# Patient Record
Sex: Female | Born: 1940
Health system: Southern US, Community
[De-identification: ages and names within clinical notes are randomized; demographics above are authoritative.]

## PROBLEM LIST (undated history)

## (undated) DIAGNOSIS — I10 Essential (primary) hypertension: Secondary | ICD-10-CM

## (undated) DIAGNOSIS — I509 Heart failure, unspecified: Secondary | ICD-10-CM

## (undated) DIAGNOSIS — E119 Type 2 diabetes mellitus without complications: Secondary | ICD-10-CM

## (undated) DIAGNOSIS — M1990 Unspecified osteoarthritis, unspecified site: Secondary | ICD-10-CM

## (undated) DIAGNOSIS — K8020 Calculus of gallbladder without cholecystitis without obstruction: Secondary | ICD-10-CM

## (undated) DIAGNOSIS — I2510 Atherosclerotic heart disease of native coronary artery without angina pectoris: Secondary | ICD-10-CM

## (undated) DIAGNOSIS — I5043 Acute on chronic combined systolic (congestive) and diastolic (congestive) heart failure: Secondary | ICD-10-CM

## (undated) HISTORY — PX: CHOLECYSTECTOMY: SHX55

## (undated) HISTORY — DX: Acute on chronic combined systolic (congestive) and diastolic (congestive) heart failure: I50.43

## (undated) HISTORY — DX: Calculus of gallbladder without cholecystitis without obstruction: K80.20

## (undated) HISTORY — PX: CATARACT EXTRACTION: SUR2

## (undated) HISTORY — DX: Atherosclerotic heart disease of native coronary artery without angina pectoris: I25.10

## (undated) HISTORY — PX: ABDOMINAL HYSTERECTOMY: SHX81

## (undated) HISTORY — PX: TOTAL KNEE ARTHROPLASTY: SHX125

---

## 1999-03-26 ENCOUNTER — Encounter: Admission: RE | Admit: 1999-03-26 | Discharge: 1999-03-26 | Payer: Self-pay | Admitting: Family Medicine

## 1999-03-26 ENCOUNTER — Encounter: Payer: Self-pay | Admitting: Family Medicine

## 2000-04-01 ENCOUNTER — Encounter: Payer: Self-pay | Admitting: Family Medicine

## 2000-04-01 ENCOUNTER — Encounter: Admission: RE | Admit: 2000-04-01 | Discharge: 2000-04-01 | Payer: Self-pay | Admitting: Family Medicine

## 2000-04-20 ENCOUNTER — Other Ambulatory Visit: Admission: RE | Admit: 2000-04-20 | Discharge: 2000-04-20 | Payer: Self-pay | Admitting: Family Medicine

## 2001-04-25 ENCOUNTER — Encounter: Admission: RE | Admit: 2001-04-25 | Discharge: 2001-04-25 | Payer: Self-pay | Admitting: Family Medicine

## 2001-04-25 ENCOUNTER — Encounter: Payer: Self-pay | Admitting: Family Medicine

## 2001-10-10 ENCOUNTER — Other Ambulatory Visit: Admission: RE | Admit: 2001-10-10 | Discharge: 2001-10-10 | Payer: Self-pay | Admitting: Family Medicine

## 2002-01-18 ENCOUNTER — Ambulatory Visit (HOSPITAL_COMMUNITY): Admission: RE | Admit: 2002-01-18 | Discharge: 2002-01-18 | Payer: Self-pay | Admitting: Gastroenterology

## 2002-01-18 ENCOUNTER — Encounter (INDEPENDENT_AMBULATORY_CARE_PROVIDER_SITE_OTHER): Payer: Self-pay | Admitting: Specialist

## 2002-04-27 ENCOUNTER — Emergency Department (HOSPITAL_COMMUNITY): Admission: EM | Admit: 2002-04-27 | Discharge: 2002-04-27 | Payer: Self-pay | Admitting: Emergency Medicine

## 2002-04-27 ENCOUNTER — Encounter: Payer: Self-pay | Admitting: Emergency Medicine

## 2002-06-18 ENCOUNTER — Encounter: Admission: RE | Admit: 2002-06-18 | Discharge: 2002-06-18 | Payer: Self-pay | Admitting: Family Medicine

## 2002-06-18 ENCOUNTER — Encounter: Payer: Self-pay | Admitting: Family Medicine

## 2002-10-16 ENCOUNTER — Other Ambulatory Visit: Admission: RE | Admit: 2002-10-16 | Discharge: 2002-10-16 | Payer: Self-pay | Admitting: Family Medicine

## 2002-10-31 ENCOUNTER — Encounter: Payer: Self-pay | Admitting: Family Medicine

## 2002-10-31 ENCOUNTER — Ambulatory Visit (HOSPITAL_COMMUNITY): Admission: RE | Admit: 2002-10-31 | Discharge: 2002-10-31 | Payer: Self-pay | Admitting: Family Medicine

## 2003-07-01 ENCOUNTER — Encounter: Admission: RE | Admit: 2003-07-01 | Discharge: 2003-07-01 | Payer: Self-pay | Admitting: Family Medicine

## 2003-09-04 ENCOUNTER — Ambulatory Visit (HOSPITAL_COMMUNITY): Admission: RE | Admit: 2003-09-04 | Discharge: 2003-09-04 | Payer: Self-pay | Admitting: Orthopedic Surgery

## 2003-09-10 ENCOUNTER — Ambulatory Visit (HOSPITAL_COMMUNITY): Admission: RE | Admit: 2003-09-10 | Discharge: 2003-09-10 | Payer: Self-pay | Admitting: Orthopedic Surgery

## 2003-09-12 ENCOUNTER — Encounter (HOSPITAL_COMMUNITY): Admission: RE | Admit: 2003-09-12 | Discharge: 2003-12-11 | Payer: Self-pay | Admitting: Orthopedic Surgery

## 2003-09-16 ENCOUNTER — Inpatient Hospital Stay (HOSPITAL_COMMUNITY): Admission: AD | Admit: 2003-09-16 | Discharge: 2003-09-19 | Payer: Self-pay | Admitting: Orthopedic Surgery

## 2003-09-17 ENCOUNTER — Encounter (INDEPENDENT_AMBULATORY_CARE_PROVIDER_SITE_OTHER): Payer: Self-pay | Admitting: Specialist

## 2004-07-22 ENCOUNTER — Encounter: Admission: RE | Admit: 2004-07-22 | Discharge: 2004-07-22 | Payer: Self-pay | Admitting: Family Medicine

## 2005-02-24 ENCOUNTER — Ambulatory Visit (HOSPITAL_COMMUNITY): Admission: RE | Admit: 2005-02-24 | Discharge: 2005-02-24 | Payer: Self-pay | Admitting: Gastroenterology

## 2005-02-24 ENCOUNTER — Encounter (INDEPENDENT_AMBULATORY_CARE_PROVIDER_SITE_OTHER): Payer: Self-pay | Admitting: *Deleted

## 2005-05-06 ENCOUNTER — Inpatient Hospital Stay (HOSPITAL_COMMUNITY): Admission: RE | Admit: 2005-05-06 | Discharge: 2005-05-11 | Payer: Self-pay | Admitting: Orthopedic Surgery

## 2005-07-26 ENCOUNTER — Encounter: Admission: RE | Admit: 2005-07-26 | Discharge: 2005-07-26 | Payer: Self-pay | Admitting: Family Medicine

## 2006-09-02 ENCOUNTER — Encounter: Admission: RE | Admit: 2006-09-02 | Discharge: 2006-09-02 | Payer: Self-pay | Admitting: Family Medicine

## 2008-01-04 ENCOUNTER — Encounter: Admission: RE | Admit: 2008-01-04 | Discharge: 2008-01-04 | Payer: Self-pay | Admitting: Family Medicine

## 2009-01-09 ENCOUNTER — Encounter: Admission: RE | Admit: 2009-01-09 | Discharge: 2009-01-09 | Payer: Self-pay | Admitting: Family Medicine

## 2010-01-12 ENCOUNTER — Encounter: Admission: RE | Admit: 2010-01-12 | Discharge: 2010-01-12 | Payer: Self-pay | Admitting: Family Medicine

## 2010-05-04 ENCOUNTER — Encounter: Payer: Self-pay | Admitting: Family Medicine

## 2010-08-28 NOTE — Op Note (Signed)
NAME:  Alexandra Green, KOVICH NO.:  [id number]   MEDICAL RECORD NO.:  [id number]          PATIENT TYPE:  AMB   LOCATION:  ENDO                         FACILITY:  [facility]   PHYSICIAN:  Anselmo Rod, M.D.  DATE OF BIRTH:  1941-02-14   DATE OF PROCEDURE:  02/24/2005  DATE OF DISCHARGE:                                 OPERATIVE REPORT   PROCEDURE:  Colonoscopy with core biopsies x 1.   ENDOSCOPIST:  Charna Elizabeth, M.D.   INSTRUMENT USED:  Olympus video colonoscope.   INDICATIONS FOR PROCEDURE:  70 year old African American female with a  history of blood in stool on Indocin, rule out colonic polyps, masses, etc.   PREPROCEDURE PREPARATION:  Informed consent was obtained from the patient.  The patient was fasted for eight hours prior to the procedure and prepped  with a bottle of magnesium citrate and a gallon of GoLYTELY the night prior  to the procedure.  The risks and benefits of the procedure including a 10%  miss rate of cancer and polyps were discussed with the patient, as well.   PREPROCEDURE PHYSICAL:  Patient with stable vital signs.  Neck supple.  Chest clear to auscultation.  Abdomen obese with no epigastric tenderness to  palpation, no guarding, no rebound, no rigidity, no hepatosplenomegaly.   DESCRIPTION OF PROCEDURE:  The patient was placed in the left lateral  decubitus position, sedated with an additional 20 mg of Demerol, the patient  had received Demerol and Versed for the EGD.  Once the patient was  adequately sedated, maintained on low flow oxygen and continuous cardiac  monitoring, the Olympus video colonoscope was advanced from the rectum to  the cecum.  The patient's position was changed from the left lateral to the  supine position with gentle application of abdominal pressure to reach the  cecal base.  A small sessile polyp was biopsied from 70 cm (cold biopsies x  1), a large lipoma was seen in the proximal right colon distal to the IC  valve.   The patient's position was changed from the left lateral to the  supine position with gentle application of abdominal pressure to reach the  cecal base.  The appendicular orifice and ileocecal valve were visualized  and photographed.  No other masses or polyps were identified.  A few early  sigmoid diverticula were present.  The patient tolerated the procedure well  without any complications.   IMPRESSION:  1.  A few early sigmoid diverticula.  2.  Large lipoma in the proximal right colon.  3.  A small sessile polyp removed by cold biopsy from 70 cm.  4.  Small internal hemorrhoids seen on retroflexion.   RECOMMENDATIONS:  1.  Await pathology results.  2.  Avoid all nonsteroidals for now.  3.  Outpatient follow up in the next two weeks for further recommendations.      Anselmo Rod, M.D.  Electronically Signed     JNM/MEDQ  D:  02/24/2005  T:  02/24/2005  Job:  [id number]   cc:   [name], M.D.  Fax: [phone]   Myrtie Neither,  MD  Fax: [phone]

## 2010-08-28 NOTE — Discharge Summary (Signed)
NAME:  Alexandra Green, Alexandra Green                       ACCOUNT NO.:  [id number]   MEDICAL RECORD NO.:  [id number]                   PATIENT TYPE:  INP   LOCATION:  5014                                 FACILITY:  [facility]   PHYSICIAN:  Myrtie Neither, M.D.                 DATE OF BIRTH:  Nov 18, 1940   DATE OF ADMISSION:  09/16/2003  DATE OF DISCHARGE:  09/19/2003                                 DISCHARGE SUMMARY   ADMISSION DIAGNOSES:  Osteomyelitis, diskitis of lumbar spine, rule out  mycotic disease.   DISCHARGE DIAGNOSES:  Osteomyelitis, diskitis of lumbar spine.   OPERATION:  None.   PROCEDURE:  Biopsy disk space by radiology.   CONSULTATIONS:  With infectious disease.   COMPLICATIONS:  None.   INFECTIONS:  Osteomyelitis, diskitis, lumbar vertebrae.   PERTINENT HISTORY:  This is a 70 year old female who I saw in the office for  lower back and right hip pain. The patient had been treated with anti-  inflammatories and muscle relaxants. The patient had recent MRI which  demonstrated diskitis and a disk space infection, osteomyelitis involving L4-  L5 disk space with involvement of only anterior longitudinal ligament. The  patient had C. reactive protein done as outpatient which was 0.6 which was  normal. Sed rate was 31. White cell count was normal, afebrile. Blood  cultures x2 which were negative. The patient had occupational PPD done which  was also negative. The patient was experiencing progressively worsening with  pain, weakness in the legs, and difficulty getting about.   PERTINENT PHYSICAL:  Lumbar minimal tender in mid lumbar area with only  trace straight leg raise bilaterally. Extensor hallucis is intact, no  clonus. Muscle tone and muscle strength is much improved from her previous  examination prior to the MRI. Deep tendon reflexes were +1 bilaterally  patella and Achilles. Pulses were intact. MRI demonstrated osteomyelitis and  diskitis at L4-L5 level with involvement along  the anterior longitudinal  ligament.   HOSPITAL COURSE:  The patient did have a short course of Keflex on  outpatient basis. The patient was admitted, had infectious disease  evaluation and scheduled for disk space biopsy. The patient underwent  procedure and tolerated procedure quite well. The patient had minimal lower  back and leg pain along the sciatic nerve distribution. IV antibiotics were  held off per recommendation of infectious disease. The patient's tissue  underwent cultures and Gram stain. Blood cultures remained negative. No acid  fast bacilli were isolated in six weeks. Renal cultures did not show any  growth in three days. Due to patient's reduced symptoms and present negative  cultures, recommendation was to observe the patient without antibiotics.  Plan on repeating the biopsy in four weeks as well as repeat of the  laboratory. The patient was discharged, continued on pain medication and  muscle relaxants p.r.n. The patient will followed up also by infectious  disease. Return to the office  in a two-week period. The patient was  discharged in stable and satisfactory condition.                                                Myrtie Neither, M.D.    AC/MEDQ  D:  11/13/2003  T:  11/14/2003  Job:  [id number]

## 2010-08-28 NOTE — Discharge Summary (Signed)
NAME:  Alexandra Green, Alexandra Green NO.:  [id number]   MEDICAL RECORD NO.:  [id number]          PATIENT TYPE:  INP   LOCATION:  5025                         FACILITY:  [facility]   PHYSICIAN:  Myrtie Neither, MD      DATE OF BIRTH:  07-May-1940   DATE OF ADMISSION:  05/06/2005  DATE OF DISCHARGE:  05/11/2005                                 DISCHARGE SUMMARY   ADMISSION DIAGNOSIS:  Degenerative arthropathy, left knee.   DISCHARGE DIAGNOSIS:  Degenerative arthropathy, left knee.   COMPLICATIONS:  Patient did have febrile episode coughing up yellow mucus.  Patient was placed on antibiotics.  Keflex 600 mg q.6h.  Encouraged to use  incentive spirometry.   PERTINENT HISTORY:  This is a 70 year old female followed in the office for  degenerative arthropathy, treated with anti-inflammatories and pain  medication, use of cane and walker with locking and giving way of the left  knee.  Patient pertinent physical was that of the left knee.  Crepitus,  effusion, limited range of motion.  Crepitus medial and lateral compartment  in patellofemoral joint with slight subluxation of the knee.   HOSPITAL COURSE:  Patient had preop medical evaluation by primary care  physician and found to be stable to undergo surgery.  Preop laboratories,  CBC, EKG, chest x-ray, urinalysis, PT, PTT, platelet count, CMET which were  all stable enough for patient to undergo surgery.  Patient underwent left  total knee arthroplasty.  Tolerated this procedure quite well.   POSTOP COURSE:  Patient did run febrile course day before discharge and was  placed on oral antibiotics with productive sputum.  Patient able to be  discharged on Keflex, Percocet every four hours as needed for pain, home  CPM, home health nurse and physical therapy.  Patient discharged in stable  condition.  Return to office in one week.  Partial weightbearing on left  side.      Myrtie Neither, MD  Electronically Signed     AC/MEDQ  D:   07/06/2005  T:  07/07/2005  Job:  [id number]

## 2010-08-28 NOTE — Op Note (Signed)
NAME:  Alexandra Green, Alexandra Green NO.:  [id number]   MEDICAL RECORD NO.:  [id number]          PATIENT TYPE:  AMB   LOCATION:  ENDO                         FACILITY:  [facility]   PHYSICIAN:  Anselmo Rod, M.D.  DATE OF BIRTH:  October 28, 1940   DATE OF PROCEDURE:  02/24/2005  DATE OF DISCHARGE:                                 OPERATIVE REPORT   PROCEDURE:  Esophagogastroduodenoscopy with antral biopsies.   ENDOSCOPIST:  Anselmo Rod, M.D.   INSTRUMENT USED:  Olympus video panendoscope.   INDICATIONS FOR PROCEDURE:  This 70 year old African/American female with a  history of melena for the last four days.  The patient has been on Indocin  since the summer of this year.  Rule out peptic ulcer disease, esophagitis,  gastritis, etc.   PRE-PROCEDURE PREPARATION:  An informed consent was procured from the  patient.  The patient was fasted for four hours prior to the procedure.   PRE-PROCEDURE PHYSICAL EXAMINATION:  VITAL SIGNS:  Stable.  NECK:  Supple.  CHEST:  Clear to auscultation.  HEART:  S1, S2 regular.  ABDOMEN:  Morbidly obese with epigastric tenderness on palpation with  guarding.  No rebound or rigidity.  No hepatosplenomegaly.   DESCRIPTION OF PROCEDURE:  The patient was placed in the left lateral  decubitus position and sedated with 40 mg of Demerol and 4 mg of Versed in  slow incremental doses.  Once the patient was adequately sedated, and  maintained on low-flow oxygen and continuous cardiac monitoring, the Olympus  video panendoscope was advanced through the mouth piece, over the tongue and  into the esophagus under direct vision.  The entire esophagus appeared  normal and widely patent, with no evidence of rings, strictures, masses,  esophagitis, or varus mucosa.  The scope was then advanced to the stomach.  Moderate diffuse gastritis was noted throughout the gastric mucosa with more  prominent changes in the antrum.  Biopsies were done for the presence of H.  pylori by pathology.  A large ulcer was seen in the duodenal bulb without  any evidence of a visible vessel.  This is the patient's probable site of GI  bleeding in the recent week.  The small bowel distal to the bulb, up to 60  cm appeared normal.  There was no outlet obstruction.  Retroflexion in the  high cardia revealed no evidence of a hiatal hernia.   IMPRESSION:  1.  Normal-appearing esophagus.  2.  Diffuse gastritis with more prominent changes in the antrum.  Biopsies      done for Helicobacter pylori.  3.  Large ulcer in the duodenal bulb without evidence of a visible vessel.  4.  Normal small bowel distal to the bulb, up to 60 cm.   RECOMMENDATIONS:  1.  Discontinue all nonsteroidals for now.  2.  Increase PPI's to b.i.d.  3.  Treat with antibiotics if H. pylori is present on biopsy.  4.  Proceed with a colonoscopy at this time. Further recommendations will be      made thereafter.      Anselmo Rod, M.D.  Electronically Signed  JNM/MEDQ  D:  02/24/2005  T:  02/24/2005  Job:  [id number]   cc:   [name], M.D.  Fax: [phone]   Myrtie Neither, MD  Fax: [phone]

## 2010-08-28 NOTE — Op Note (Signed)
NAME:  Alexandra Green, Alexandra Green NO.:  [id number]   MEDICAL RECORD NO.:  [id number]          PATIENT TYPE:  INP   LOCATION:  5025                         FACILITY:  [facility]   PHYSICIAN:  Myrtie Neither, MD      DATE OF BIRTH:  [birth year]   DATE OF PROCEDURE:  05/06/2005  DATE OF DISCHARGE:  05/11/2005                                 OPERATIVE REPORT   PREOPERATIVE DIAGNOSIS:  Degenerative arthritis, left knee.   POSTOPERATIVE DIAGNOSIS:  Degenerative arthritis, left knee.   COMPLICATIONS:  None.   INFECTIONS:  None.   OPERATION PERFORMED:  Left total knee arthroplasty.   SURGEON:  Myrtie Neither, MD   ANESTHESIA:  General.   ESTIMATED BLOOD LOSS:  100 mL.   DESCRIPTION OF PROCEDURE:  The patient was taken to the operating room after  given adequate preop medication and given general anesthesia and intubated.  Left lower extremity was prepped with DuraPrep and draped in sterile manner.  Tourniquet and Bovie used for hemostasis.  Anterior midline incision made at  the left knee going through skin and subcutaneous tissue extending from the  quadricep down to tibial tuberosity.  Sharp and blunt dissection was done  medially and medial paramedian incision made into the capsule extending from  the quadriceps down to the tibial tuberosity.  Patella was reflected  laterally.  Osteophytes about the patella, femur and tibia resected.  Soft  tissue resection was then done with the knee in the flexed position.  The  tibial cutting jig was put in place for removal of 10 mm of tibial surface.  IM rod reaming was then done, put down the femoral canal and distal femoral  cutting jig was put in place followed by sizing of the femur which was 65  mm.  Anterior, posterior and chamfering cut jig was put in place and  appropriate cuts were made.  Trial component of the femur was done which was  found to fit very snug.  Attention was then turned back to the tibia.  Tibial sizing was 71 mm.   Appropriate cutting jigs were cut in place and the  tibial surface was prepared.  Trial component was put in place for the femur  and the tibial component.  Range of motion full extension, full flexion,  good medial and lateral stability.  This was most stable with 16 mm poly.  Next attention was then turned to patella which was sized at a large sized  patella, 37 mm.  Appropriate sizing was done followed by placement of  cutting jig for resurfacing of patella.  All three components were put in  place. Range of motion full flexion, full extension, no subluxation of  patella and this is best felt with the 16 mm poly.  Next, bone cement was  mixed and the tibia and patella were cemented and the femoral component was  press-fit.  Again range of motion found to be good full extension, full  flexion, good medial and lateral stability, no subluxation, patella with  good tracking.  Tourniquet was then let down.  Hemostasis obtained.  Copious  irrigation  was then done.  Final implants were put in place were 65 mm  femoral component, press-fit, porous coated. 16 x 71 mm poly, 71 mm fixed  high beam tibial plate and 37 mm large size patella.  These were [employer]  components.  The  poly was then locked in place with the key.  After hemostasis obtained,  wound closing was done with 0 Vicryl to the fascia, 2-0 for the subcutaneous  and skin staples for the skin.  Compressive dressing was applied.  The  patient tolerated the procedure well, went to recovery room in stable  satisfactory condition.      Myrtie Neither, MD  Electronically Signed     AC/MEDQ  D:  07/06/2005  T:  07/07/2005  Job:  [id number]

## 2010-08-28 NOTE — Op Note (Signed)
NAME:  Alexandra Green, Alexandra Green                       ACCOUNT NO.:  [id number]   MEDICAL RECORD NO.:  [id number]                   PATIENT TYPE:  AMB   LOCATION:  ENDO                                 FACILITY:  [facility]   PHYSICIAN:  Anselmo Rod, M.D.               DATE OF BIRTH:  [birth year]   DATE OF PROCEDURE:  01/18/2002  DATE OF DISCHARGE:                                 OPERATIVE REPORT   PROCEDURE PERFORMED:  Colonoscopy with biopsies time four.   ENDOSCOPIST:  Charna Elizabeth, M.D.   INSTRUMENT USED:  Olympus video colonoscope.   INDICATIONS FOR PROCEDURE:  The patient is a 70 year old African-American  female with a history of change in bowel habits and constipation.  The  patient has a family history of breast cancer but denies a family history of  colon cancer.   PREPROCEDURE PREPARATION:  Informed consent was procured from the patient.  The patient was fasted for eight hours prior to the procedure and prepped  with a bottle of magnesium citrate and a gallon of NuLytely the night prior  to the procedure.   PREPROCEDURE PHYSICAL:  The patient had stable vital signs. Neck supple.  Chest clear to auscultation.  S1 and S2 regular.  Abdomen soft with normal  bowel sounds.   DESCRIPTION OF PROCEDURE:  The patient was placed in left lateral decubitus  position and sedated with 50 mg of Demerol and 5 mg of Versed intravenously.  Once the patient was adequately sedated and maintained on low flow oxygen  and continuous cardiac monitoring, the Olympus video colonoscope was  advanced from the rectum to the cecum with difficulty.  The patient was  moderately obese.  Multiple positions were changed from the left lateral to  supine and the right lateral position to reach the cecal base.  There was  some residual stool in the right colon and multiple washes were done.  The  cecal base was clearly visualized.  A lipomatous lesion was seen in the mid  right colon.  It was biopsied for  pathology.  No other abnormalities were  noted.  There was evidence of early diverticular disease in the left colon.  The patient tolerated the procedure well without complications.   IMPRESSION:  1. Early left-sided diverticulosis.  2. Question lipoma in mid right colon.  3. No other masses or polyps seen.   RECOMMENDATIONS:  1. A high fiber diet has been discussed with the patient and __________     brochures have been given to her for her education.  Liberal fluid intake     has been advocated.  2. Repeat colorectal cancer screening has been recommended in the next 10     years unless the patient develops any abnormal symptoms in the interim.  3. Outpatient follow-up in the next two weeks or earlier if need be.  4.     Patient wanted to try Zelnorm, actually has  been advised to contact the     office with regard to results of this medication and request a refill if     this seems to help her symptoms; however, the importance of 15 to 20 gm     of fiber in the diet have been re-emphasized and should not be replaced     by the use of Zelnorm.                                                   Anselmo Rod, M.D.    JNM/MEDQ  D:  01/18/2002  T:  01/18/2002  Job:  [id number]   cc:   [name], MD

## 2010-08-28 NOTE — H&P (Signed)
NAME:  Alexandra Green, Alexandra Green                       ACCOUNT NO.:  [id number]   MEDICAL RECORD NO.:  [id number]                   PATIENT TYPE:  INP   LOCATION:  5014                                 FACILITY:  [facility]   PHYSICIAN:  Myrtie Neither, M.D.                 DATE OF BIRTH:  10-11-1940   DATE OF ADMISSION:  09/16/2003  DATE OF DISCHARGE:                                HISTORY & PHYSICAL   CHIEF COMPLAINT:  Lower back and leg pain.   HISTORY OF PRESENT ILLNESS:  This is a 70 year old female previously treated  for lower back sciatic type pain.  Over the past several weeks, the patient  had MRI done approximately a week and a half ago because of progressive  worsening of symptoms which was that of radiation of pain into the legs and  feeling of weakness in both legs.  MRI demonstrated diskitis and disk  infection, osteomyelitis involving L4-L5 disk space and with __________ of  the anterior longitudinal ligament.  The patient had C-reactive protein done  which was 0.6 which is normal.  Sed rate of only 31.  White cell count was  normal.  Afebrile and had blood cultures x 2 done which so far have been  negative.  The patient's urine test also was negative.  The patient has a  history of having PPD done occupationally at [facility] which was  also negative.  The patient is being admitted for a biopsy to the area and  infectious disease evaluation.  The patient was placed on Keflex 500 mg  q.6h.   PAST MEDICAL HISTORY:  1. Diabetes mellitus.  2. High blood pressure.   ALLERGIES:  SPORANOX which causes a rash.   FAMILY HISTORY:  Diabetes mellitus, prostatic cancer, coronary artery  disease.   MEDICATIONS:  Amaryl, Metformin, Actos, Detrol, Norvasc, Lasix, Skelaxin,  HCTZ, Lisinopril, and Keflex.   REVIEW OF SYSTEMS:  No cardiac arrest.  Some hesitancy on urination.  Numbness in both lower extremities off and on.   SOCIAL HISTORY:  The patient has no history of use of  alcohol or tobacco and  lives with her son.   PHYSICAL EXAMINATION:  GENERAL:  Alert and oriented, no acute distress.  VITAL SIGNS:  Temperature 98.6, pulse 80, respirations 20, blood pressure  124/58.  CBG of 114.  Height 5' 7, weight 318.  HEAD:  Normocephalic.  EYES:  Conjunctivae sclerae clear.  NECK:  Supple.  CHEST:  Clear.  CARDIAC:  S1 S2 regular.  BACK:  Lumbar minimally tender in the mid lumbar area with only a trace  straight leg raise bilaterally.  Extensor hallus intact.  No clonus.  Muscle  tone and muscle strength are much improved from previous examination.  Deep  tendon reflexes +1 bilateral patella and Achilles.  Pulses intact.   LABORATORY:  The patient had MRI done demonstrating osteomyelitis, diskitis  at L5-L5 with __________ at  the anterior longitudinal ligament.   IMPRESSION:  Osteomyelitis, diskitis lumbar spine rule out mytotic disease.   PLAN:  Admission for a biopsy of the area as well as infectious disease  consultation.                                                Myrtie Neither, M.D.    AC/MEDQ  D:  09/17/2003  T:  09/18/2003  Job:  [id number]

## 2010-08-28 NOTE — Op Note (Signed)
NAME:  Alexandra Green, Alexandra Green NO.:  [id number]   MEDICAL RECORD NO.:  [id number]          PATIENT TYPE:  AMB   LOCATION:  ENDO                         FACILITY:  [facility]   PHYSICIAN:  Anselmo Rod, M.D.  DATE OF BIRTH:  16-Feb-1941   DATE OF PROCEDURE:  02/24/2005  DATE OF DISCHARGE:                                 OPERATIVE REPORT   PROCEDURE PERFORMED:  Colonoscopy with cold biopsies times one.   ENDOSCOPIST:  Charna Elizabeth, M.D.   INSTRUMENT USED:  Olympus video colonoscope.   INDICATIONS FOR PROCEDURE:  The patient is a 70 year old African-American  female with history of blood in stool.  Rule out colonic polyps, masses,  etc.   PREPROCEDURE PREPARATION:  Informed consent was procured from the patient.  The patient was fasted for eight hours prior to the procedure and prepped  with a bottle of magnesium citrate and a gallon of GoLytely the night prior  to the procedure.  The risks and benefits of the procedure including a 10%  miss rate for polyps or cancers was discussed with the patient as well.  The  patient was advised to discontinue Indocin and all nonsteroidals prior to  the procedure.   PREPROCEDURE PHYSICAL:  The patient had stable vital signs.  Neck supple.  Chest clear to auscultation.  S1 and S2 regular.  Abdomen obese with  epigastric tenderness on palpation with guarding, no rebound, no rigidity,  no hepatosplenomegaly.  Surgical scar is present from an open  cholecystectomy and hysterectomy done in remote past.   DESCRIPTION OF PROCEDURE:  The patient was placed in left lateral decubitus  position and sedated with an additional 20 mg of Demerol.  The patient  received Demerol and Versed prior to her colonoscopy.  Once the patient was  adequately sedated and maintained on low flow oxygen and continuous cardiac  monitoring, the Olympus video colonoscope was advanced from the rectum to  the cecum.  The appendicular orifice and ileocecal valve were  clearly  visualized and photographed.  A large lipoma was noted in the proximal right  colon. A few sigmoid diverticula were present.  A small sessile polyp was  biopsied from 70 cm.  Small internal hemorrhoids were seen on retroflexion.  The rest of the exam was unremarkable.  There was some residual stool in the  colon.  Multiple washes were done.  The patient tolerated the procedure well  without immediate complication.   IMPRESSION:  1.  Small nonbleeding internal hemorrhoids.  2.  Small sessile polyp biopsied (cold biopsies times one) from 70 cm.  3.  Few early sigmoid diverticula.  4.  Large lipoma in proximal right colon.   RECOMMENDATIONS:  1.  Await pathology results.  2.  Outpatient followup in the next two weeks for further recommendations.  3.  Avoid all nonsteroidals for now.      Anselmo Rod, M.D.  Electronically Signed     JNM/MEDQ  D:  02/24/2005  T:  02/25/2005  Job:  [id number]   cc:   [name], M.D.  Fax: [phone]   Myrtie Neither, MD  Fax: [phone]

## 2011-01-01 ENCOUNTER — Other Ambulatory Visit: Payer: Self-pay | Admitting: Family Medicine

## 2011-01-01 DIAGNOSIS — Z1231 Encounter for screening mammogram for malignant neoplasm of breast: Secondary | ICD-10-CM

## 2011-01-19 ENCOUNTER — Ambulatory Visit: Payer: Self-pay

## 2011-02-22 ENCOUNTER — Ambulatory Visit
Admission: RE | Admit: 2011-02-22 | Discharge: 2011-02-22 | Disposition: A | Discharge status: Home / Self Care | Payer: Medicare Other | Source: Ambulatory Visit | Attending: Family Medicine | Admitting: Family Medicine

## 2011-02-22 DIAGNOSIS — Z1231 Encounter for screening mammogram for malignant neoplasm of breast: Secondary | ICD-10-CM

## 2011-03-02 ENCOUNTER — Other Ambulatory Visit: Payer: Self-pay | Admitting: Family Medicine

## 2011-03-02 DIAGNOSIS — R928 Other abnormal and inconclusive findings on diagnostic imaging of breast: Secondary | ICD-10-CM

## 2011-03-16 ENCOUNTER — Ambulatory Visit
Admission: RE | Admit: 2011-03-16 | Discharge: 2011-03-16 | Disposition: A | Discharge status: Home / Self Care | Payer: Medicare Other | Source: Ambulatory Visit | Attending: Family Medicine | Admitting: Family Medicine

## 2011-03-16 DIAGNOSIS — R928 Other abnormal and inconclusive findings on diagnostic imaging of breast: Secondary | ICD-10-CM

## 2012-03-16 ENCOUNTER — Other Ambulatory Visit: Payer: Self-pay | Admitting: Family Medicine

## 2012-03-16 DIAGNOSIS — Z1231 Encounter for screening mammogram for malignant neoplasm of breast: Secondary | ICD-10-CM

## 2012-04-24 ENCOUNTER — Ambulatory Visit
Admission: RE | Admit: 2012-04-24 | Discharge: 2012-04-24 | Disposition: A | Discharge status: Home / Self Care | Payer: Medicare Other | Source: Ambulatory Visit | Attending: Family Medicine | Admitting: Family Medicine

## 2012-04-24 DIAGNOSIS — Z1231 Encounter for screening mammogram for malignant neoplasm of breast: Secondary | ICD-10-CM

## 2013-03-28 ENCOUNTER — Other Ambulatory Visit: Payer: Self-pay

## 2013-03-28 DIAGNOSIS — Z1231 Encounter for screening mammogram for malignant neoplasm of breast: Secondary | ICD-10-CM

## 2013-05-02 ENCOUNTER — Ambulatory Visit
Admission: RE | Admit: 2013-05-02 | Discharge: 2013-05-02 | Disposition: A | Discharge status: Home / Self Care | Payer: Medicare Other | Source: Ambulatory Visit

## 2013-05-02 DIAGNOSIS — Z1231 Encounter for screening mammogram for malignant neoplasm of breast: Secondary | ICD-10-CM

## 2014-04-09 ENCOUNTER — Other Ambulatory Visit: Payer: Self-pay

## 2014-04-09 DIAGNOSIS — Z1231 Encounter for screening mammogram for malignant neoplasm of breast: Secondary | ICD-10-CM

## 2014-05-07 ENCOUNTER — Ambulatory Visit
Admission: RE | Admit: 2014-05-07 | Discharge: 2014-05-07 | Disposition: A | Discharge status: Home / Self Care | Payer: Medicare Other | Source: Ambulatory Visit

## 2014-05-07 DIAGNOSIS — Z1231 Encounter for screening mammogram for malignant neoplasm of breast: Secondary | ICD-10-CM | POA: Diagnosis not present

## 2014-07-02 DIAGNOSIS — I10 Essential (primary) hypertension: Secondary | ICD-10-CM | POA: Diagnosis not present

## 2014-07-02 DIAGNOSIS — E0800 Diabetes mellitus due to underlying condition with hyperosmolarity without nonketotic hyperglycemic-hyperosmolar coma (NKHHC): Secondary | ICD-10-CM | POA: Diagnosis not present

## 2014-07-02 DIAGNOSIS — Z0000 Encounter for general adult medical examination without abnormal findings: Secondary | ICD-10-CM | POA: Diagnosis not present

## 2014-07-02 DIAGNOSIS — L658 Other specified nonscarring hair loss: Secondary | ICD-10-CM | POA: Diagnosis not present

## 2014-10-30 DIAGNOSIS — I10 Essential (primary) hypertension: Secondary | ICD-10-CM | POA: Diagnosis not present

## 2014-10-30 DIAGNOSIS — E0800 Diabetes mellitus due to underlying condition with hyperosmolarity without nonketotic hyperglycemic-hyperosmolar coma (NKHHC): Secondary | ICD-10-CM | POA: Diagnosis not present

## 2014-11-01 DIAGNOSIS — H2513 Age-related nuclear cataract, bilateral: Secondary | ICD-10-CM | POA: Diagnosis not present

## 2014-11-01 DIAGNOSIS — E119 Type 2 diabetes mellitus without complications: Secondary | ICD-10-CM | POA: Diagnosis not present

## 2014-11-01 DIAGNOSIS — H04123 Dry eye syndrome of bilateral lacrimal glands: Secondary | ICD-10-CM | POA: Diagnosis not present

## 2014-11-01 DIAGNOSIS — H43811 Vitreous degeneration, right eye: Secondary | ICD-10-CM | POA: Diagnosis not present

## 2015-02-28 DIAGNOSIS — Z6841 Body Mass Index (BMI) 40.0 and over, adult: Secondary | ICD-10-CM | POA: Diagnosis not present

## 2015-02-28 DIAGNOSIS — Z23 Encounter for immunization: Secondary | ICD-10-CM | POA: Diagnosis not present

## 2015-02-28 DIAGNOSIS — E089 Diabetes mellitus due to underlying condition without complications: Secondary | ICD-10-CM | POA: Diagnosis not present

## 2015-02-28 DIAGNOSIS — R461 Bizarre personal appearance: Secondary | ICD-10-CM | POA: Diagnosis not present

## 2015-02-28 DIAGNOSIS — M130 Polyarthritis, unspecified: Secondary | ICD-10-CM | POA: Diagnosis not present

## 2015-02-28 DIAGNOSIS — I10 Essential (primary) hypertension: Secondary | ICD-10-CM | POA: Diagnosis not present

## 2015-03-18 DIAGNOSIS — M5441 Lumbago with sciatica, right side: Secondary | ICD-10-CM | POA: Diagnosis not present

## 2015-03-18 DIAGNOSIS — M62830 Muscle spasm of back: Secondary | ICD-10-CM | POA: Diagnosis not present

## 2015-03-18 DIAGNOSIS — M545 Low back pain: Secondary | ICD-10-CM | POA: Diagnosis not present

## 2015-03-21 DIAGNOSIS — M5441 Lumbago with sciatica, right side: Secondary | ICD-10-CM | POA: Diagnosis not present

## 2015-03-21 DIAGNOSIS — M62830 Muscle spasm of back: Secondary | ICD-10-CM | POA: Diagnosis not present

## 2015-03-21 DIAGNOSIS — M545 Low back pain: Secondary | ICD-10-CM | POA: Diagnosis not present

## 2015-03-25 DIAGNOSIS — M62830 Muscle spasm of back: Secondary | ICD-10-CM | POA: Diagnosis not present

## 2015-03-25 DIAGNOSIS — M545 Low back pain: Secondary | ICD-10-CM | POA: Diagnosis not present

## 2015-03-25 DIAGNOSIS — M5441 Lumbago with sciatica, right side: Secondary | ICD-10-CM | POA: Diagnosis not present

## 2015-03-28 DIAGNOSIS — M545 Low back pain: Secondary | ICD-10-CM | POA: Diagnosis not present

## 2015-03-28 DIAGNOSIS — M5441 Lumbago with sciatica, right side: Secondary | ICD-10-CM | POA: Diagnosis not present

## 2015-03-28 DIAGNOSIS — M62830 Muscle spasm of back: Secondary | ICD-10-CM | POA: Diagnosis not present

## 2015-04-01 DIAGNOSIS — M545 Low back pain: Secondary | ICD-10-CM | POA: Diagnosis not present

## 2015-04-01 DIAGNOSIS — M5441 Lumbago with sciatica, right side: Secondary | ICD-10-CM | POA: Diagnosis not present

## 2015-04-01 DIAGNOSIS — M62830 Muscle spasm of back: Secondary | ICD-10-CM | POA: Diagnosis not present

## 2015-04-03 DIAGNOSIS — M545 Low back pain: Secondary | ICD-10-CM | POA: Diagnosis not present

## 2015-04-03 DIAGNOSIS — M62830 Muscle spasm of back: Secondary | ICD-10-CM | POA: Diagnosis not present

## 2015-04-03 DIAGNOSIS — M5441 Lumbago with sciatica, right side: Secondary | ICD-10-CM | POA: Diagnosis not present

## 2015-04-09 DIAGNOSIS — M545 Low back pain: Secondary | ICD-10-CM | POA: Diagnosis not present

## 2015-04-09 DIAGNOSIS — M5441 Lumbago with sciatica, right side: Secondary | ICD-10-CM | POA: Diagnosis not present

## 2015-04-09 DIAGNOSIS — M62830 Muscle spasm of back: Secondary | ICD-10-CM | POA: Diagnosis not present

## 2015-04-10 DIAGNOSIS — M5441 Lumbago with sciatica, right side: Secondary | ICD-10-CM | POA: Diagnosis not present

## 2015-04-10 DIAGNOSIS — M545 Low back pain: Secondary | ICD-10-CM | POA: Diagnosis not present

## 2015-04-10 DIAGNOSIS — M62830 Muscle spasm of back: Secondary | ICD-10-CM | POA: Diagnosis not present

## 2015-04-13 DIAGNOSIS — M62830 Muscle spasm of back: Secondary | ICD-10-CM | POA: Diagnosis not present

## 2015-04-13 DIAGNOSIS — M5441 Lumbago with sciatica, right side: Secondary | ICD-10-CM | POA: Diagnosis not present

## 2015-04-13 DIAGNOSIS — M545 Low back pain: Secondary | ICD-10-CM | POA: Diagnosis not present

## 2015-04-16 ENCOUNTER — Other Ambulatory Visit: Payer: Self-pay

## 2015-04-16 DIAGNOSIS — Z1231 Encounter for screening mammogram for malignant neoplasm of breast: Secondary | ICD-10-CM

## 2015-05-09 ENCOUNTER — Ambulatory Visit
Admission: RE | Admit: 2015-05-09 | Discharge: 2015-05-09 | Disposition: A | Discharge status: Home / Self Care | Payer: Medicare Other | Source: Ambulatory Visit

## 2015-05-09 DIAGNOSIS — Z1231 Encounter for screening mammogram for malignant neoplasm of breast: Secondary | ICD-10-CM | POA: Diagnosis not present

## 2015-05-29 DIAGNOSIS — E089 Diabetes mellitus due to underlying condition without complications: Secondary | ICD-10-CM | POA: Diagnosis not present

## 2015-05-29 DIAGNOSIS — I10 Essential (primary) hypertension: Secondary | ICD-10-CM | POA: Diagnosis not present

## 2015-05-29 DIAGNOSIS — Z0000 Encounter for general adult medical examination without abnormal findings: Secondary | ICD-10-CM | POA: Diagnosis not present

## 2015-05-29 DIAGNOSIS — M130 Polyarthritis, unspecified: Secondary | ICD-10-CM | POA: Diagnosis not present

## 2015-06-05 DIAGNOSIS — Z780 Asymptomatic menopausal state: Secondary | ICD-10-CM | POA: Diagnosis not present

## 2015-09-30 DIAGNOSIS — E0800 Diabetes mellitus due to underlying condition with hyperosmolarity without nonketotic hyperglycemic-hyperosmolar coma (NKHHC): Secondary | ICD-10-CM | POA: Diagnosis not present

## 2015-09-30 DIAGNOSIS — M130 Polyarthritis, unspecified: Secondary | ICD-10-CM | POA: Diagnosis not present

## 2015-09-30 DIAGNOSIS — E089 Diabetes mellitus due to underlying condition without complications: Secondary | ICD-10-CM | POA: Diagnosis not present

## 2015-09-30 DIAGNOSIS — I10 Essential (primary) hypertension: Secondary | ICD-10-CM | POA: Diagnosis not present

## 2016-01-26 DIAGNOSIS — E119 Type 2 diabetes mellitus without complications: Secondary | ICD-10-CM | POA: Diagnosis not present

## 2016-01-28 DIAGNOSIS — H25813 Combined forms of age-related cataract, bilateral: Secondary | ICD-10-CM | POA: Diagnosis not present

## 2016-01-28 DIAGNOSIS — H43813 Vitreous degeneration, bilateral: Secondary | ICD-10-CM | POA: Diagnosis not present

## 2016-01-28 DIAGNOSIS — E119 Type 2 diabetes mellitus without complications: Secondary | ICD-10-CM | POA: Diagnosis not present

## 2016-02-12 DIAGNOSIS — H25811 Combined forms of age-related cataract, right eye: Secondary | ICD-10-CM | POA: Diagnosis not present

## 2016-02-12 DIAGNOSIS — H268 Other specified cataract: Secondary | ICD-10-CM | POA: Diagnosis not present

## 2016-02-12 DIAGNOSIS — H2511 Age-related nuclear cataract, right eye: Secondary | ICD-10-CM | POA: Diagnosis not present

## 2016-02-27 DIAGNOSIS — I10 Essential (primary) hypertension: Secondary | ICD-10-CM | POA: Diagnosis not present

## 2016-02-27 DIAGNOSIS — E069 Thyroiditis, unspecified: Secondary | ICD-10-CM | POA: Diagnosis not present

## 2016-02-27 DIAGNOSIS — R52 Pain, unspecified: Secondary | ICD-10-CM | POA: Diagnosis not present

## 2016-02-27 DIAGNOSIS — E089 Diabetes mellitus due to underlying condition without complications: Secondary | ICD-10-CM | POA: Diagnosis not present

## 2016-02-27 DIAGNOSIS — Z23 Encounter for immunization: Secondary | ICD-10-CM | POA: Diagnosis not present

## 2016-02-27 DIAGNOSIS — M130 Polyarthritis, unspecified: Secondary | ICD-10-CM | POA: Diagnosis not present

## 2016-02-27 DIAGNOSIS — E118 Type 2 diabetes mellitus with unspecified complications: Secondary | ICD-10-CM | POA: Diagnosis not present

## 2016-03-12 DIAGNOSIS — Z961 Presence of intraocular lens: Secondary | ICD-10-CM | POA: Diagnosis not present

## 2016-05-14 ENCOUNTER — Encounter (HOSPITAL_COMMUNITY): Payer: Self-pay | Admitting: Nurse Practitioner

## 2016-05-14 ENCOUNTER — Emergency Department (HOSPITAL_COMMUNITY): Payer: Medicare Other

## 2016-05-14 ENCOUNTER — Inpatient Hospital Stay (HOSPITAL_COMMUNITY)
Admission: EM | Admit: 2016-05-14 | Discharge: 2016-05-18 | DRG: 308 | Disposition: A | Discharge status: Home / Self Care | Payer: Medicare Other | Attending: Internal Medicine | Admitting: Internal Medicine

## 2016-05-14 DIAGNOSIS — I4589 Other specified conduction disorders: Secondary | ICD-10-CM | POA: Diagnosis present

## 2016-05-14 DIAGNOSIS — Z7982 Long term (current) use of aspirin: Secondary | ICD-10-CM | POA: Diagnosis not present

## 2016-05-14 DIAGNOSIS — I4892 Unspecified atrial flutter: Secondary | ICD-10-CM | POA: Diagnosis not present

## 2016-05-14 DIAGNOSIS — M130 Polyarthritis, unspecified: Secondary | ICD-10-CM | POA: Diagnosis not present

## 2016-05-14 DIAGNOSIS — R0602 Shortness of breath: Secondary | ICD-10-CM | POA: Diagnosis present

## 2016-05-14 DIAGNOSIS — I340 Nonrheumatic mitral (valve) insufficiency: Secondary | ICD-10-CM | POA: Diagnosis not present

## 2016-05-14 DIAGNOSIS — E119 Type 2 diabetes mellitus without complications: Secondary | ICD-10-CM | POA: Diagnosis not present

## 2016-05-14 DIAGNOSIS — Z96652 Presence of left artificial knee joint: Secondary | ICD-10-CM | POA: Diagnosis present

## 2016-05-14 DIAGNOSIS — D72829 Elevated white blood cell count, unspecified: Secondary | ICD-10-CM | POA: Diagnosis not present

## 2016-05-14 DIAGNOSIS — Z794 Long term (current) use of insulin: Secondary | ICD-10-CM | POA: Diagnosis not present

## 2016-05-14 DIAGNOSIS — K5900 Constipation, unspecified: Secondary | ICD-10-CM | POA: Diagnosis not present

## 2016-05-14 DIAGNOSIS — I5043 Acute on chronic combined systolic (congestive) and diastolic (congestive) heart failure: Secondary | ICD-10-CM | POA: Diagnosis not present

## 2016-05-14 DIAGNOSIS — M170 Bilateral primary osteoarthritis of knee: Secondary | ICD-10-CM | POA: Diagnosis not present

## 2016-05-14 DIAGNOSIS — G4730 Sleep apnea, unspecified: Secondary | ICD-10-CM | POA: Diagnosis not present

## 2016-05-14 DIAGNOSIS — I10 Essential (primary) hypertension: Secondary | ICD-10-CM | POA: Diagnosis present

## 2016-05-14 DIAGNOSIS — I700 Atherosclerosis of aorta: Secondary | ICD-10-CM | POA: Diagnosis not present

## 2016-05-14 DIAGNOSIS — Z6841 Body Mass Index (BMI) 40.0 and over, adult: Secondary | ICD-10-CM

## 2016-05-14 DIAGNOSIS — Z8249 Family history of ischemic heart disease and other diseases of the circulatory system: Secondary | ICD-10-CM | POA: Diagnosis not present

## 2016-05-14 DIAGNOSIS — R000 Tachycardia, unspecified: Secondary | ICD-10-CM | POA: Diagnosis not present

## 2016-05-14 DIAGNOSIS — I110 Hypertensive heart disease with heart failure: Secondary | ICD-10-CM | POA: Diagnosis not present

## 2016-05-14 DIAGNOSIS — I4891 Unspecified atrial fibrillation: Secondary | ICD-10-CM | POA: Diagnosis present

## 2016-05-14 DIAGNOSIS — Z7984 Long term (current) use of oral hypoglycemic drugs: Secondary | ICD-10-CM

## 2016-05-14 DIAGNOSIS — I483 Typical atrial flutter: Secondary | ICD-10-CM | POA: Diagnosis not present

## 2016-05-14 DIAGNOSIS — E0800 Diabetes mellitus due to underlying condition with hyperosmolarity without nonketotic hyperglycemic-hyperosmolar coma (NKHHC): Secondary | ICD-10-CM | POA: Diagnosis not present

## 2016-05-14 DIAGNOSIS — I5020 Unspecified systolic (congestive) heart failure: Secondary | ICD-10-CM | POA: Diagnosis not present

## 2016-05-14 HISTORY — DX: Unspecified atrial fibrillation: I48.91

## 2016-05-14 HISTORY — DX: Unspecified osteoarthritis, unspecified site: M19.90

## 2016-05-14 HISTORY — DX: Unspecified atrial flutter: I48.92

## 2016-05-14 HISTORY — DX: Essential (primary) hypertension: I10

## 2016-05-14 HISTORY — DX: Type 2 diabetes mellitus without complications: E11.9

## 2016-05-14 LAB — CBC
HCT: 46.4 % — ABNORMAL HIGH (ref 36.0–46.0)
Hemoglobin: 15.2 g/dL — ABNORMAL HIGH (ref 12.0–15.0)
MCH: 29.5 pg (ref 26.0–34.0)
MCHC: 32.8 g/dL (ref 30.0–36.0)
MCV: 90.1 fL (ref 78.0–100.0)
Platelets: 346 10*3/uL (ref 150–400)
RBC: 5.15 MIL/uL — ABNORMAL HIGH (ref 3.87–5.11)
RDW: 15.2 % (ref 11.5–15.5)
WBC: 11.7 10*3/uL — ABNORMAL HIGH (ref 4.0–10.5)

## 2016-05-14 LAB — BASIC METABOLIC PANEL
Anion gap: 13 (ref 5–15)
BUN: 18 mg/dL (ref 6–20)
CO2: 27 mmol/L (ref 22–32)
Calcium: 9.7 mg/dL (ref 8.9–10.3)
Chloride: 101 mmol/L (ref 101–111)
Creatinine, Ser: 0.83 mg/dL (ref 0.44–1.00)
GFR calc Af Amer: >60 mL/min (ref >60)
GFR calc non Af Amer: >60 mL/min (ref >60)
Glucose, Bld: 111 mg/dL — ABNORMAL HIGH (ref 65–99)
Potassium: 4.7 mmol/L (ref 3.5–5.1)
Sodium: 141 mmol/L (ref 135–145)

## 2016-05-14 LAB — TSH: TSH: 1.711 u[IU]/mL (ref 0.350–4.500)

## 2016-05-14 MED ORDER — DEXTROSE 5 % IV SOLN
5.0000 mg/h | INTRAVENOUS | Status: DC
  Administered 2016-05-14: 5 mg/h via INTRAVENOUS
  Filled 2016-05-14: qty 100

## 2016-05-14 MED ORDER — DILTIAZEM LOAD VIA INFUSION
10.0000 mg | Freq: Once | INTRAVENOUS | Status: AC
  Administered 2016-05-14: 10 mg via INTRAVENOUS
  Filled 2016-05-14: qty 10

## 2016-05-14 NOTE — ED Provider Notes (Signed)
[facility]-EMERGENCY DEPT Provider Note   CSN: [id number] Arrival date & time: 05/14/16  1740     History   Chief Complaint Chief Complaint  Patient presents with  . Atrial Fibrillation    HPI Alexandra Green is a 76 y.o. female.  Patient is a 76 year old female with a history of hypertension and diabetes who is presenting from the [facility] clinic due to shortness of breath and has been slightly worsening over the last 6 days as well as intermittent palpitations. Patient denies any chest pain, edema, abdominal pain, nausea or vomiting. She has had no recent medication changes. She was found at the clinic to be in atrial fibrillation. She has no prior history of this cardiac rhythm.  She mostly notices the SOB with exertion.   The history is provided by the patient.    Past Medical History:  Diagnosis Date  . Diabetes mellitus without complication (HCC)   . Hypertension     There are no active problems to display for this patient.   Past Surgical History:  Procedure Laterality Date  . ABDOMINAL HYSTERECTOMY    . CHOLECYSTECTOMY    . JOINT REPLACEMENT      OB History    No data available       Home Medications    Prior to Admission medications   Not on File    Family History History reviewed. No pertinent family history.  Social History Social History  Substance Use Topics  . Smoking status: Never Smoker  . Smokeless tobacco: Never Used  . Alcohol use No     Allergies   Patient has no known allergies.   Review of Systems Review of Systems  All other systems reviewed and are negative.    Physical Exam Updated Vital Signs BP 128/79 (BP Location: Left Arm)   Pulse 115   Temp 98.7 F (37.1 C) (Oral)   Resp 18   SpO2 99%   Physical Exam  Constitutional: She is oriented to person, place, and time. She appears well-developed and well-nourished. No distress.  obese  HENT:  Head: Normocephalic and atraumatic.  Mouth/Throat: Oropharynx is clear and  moist.  Eyes: Conjunctivae and EOM are normal. Pupils are equal, round, and reactive to light.  Neck: Normal range of motion. Neck supple.  Cardiovascular: Intact distal pulses.  An irregularly irregular rhythm present. Tachycardia present.   No murmur heard. Pulmonary/Chest: Effort normal and breath sounds normal. No respiratory distress. She has no wheezes. She has no rales.  Abdominal: Soft. She exhibits no distension. There is no tenderness. There is no rebound and no guarding.  Musculoskeletal: Normal range of motion. She exhibits no edema or tenderness.  Neurological: She is alert and oriented to person, place, and time.  Skin: Skin is warm and dry. No rash noted. No erythema.  Psychiatric: She has a normal mood and affect. Her behavior is normal.  Nursing note and vitals reviewed.    ED Treatments / Results  Labs (all labs ordered are listed, but only abnormal results are displayed) Labs Reviewed  BASIC METABOLIC PANEL - Abnormal; Notable for the following:       Result Value   Glucose, Bld 111 (*)    All other components within normal limits  CBC - Abnormal; Notable for the following:    WBC 11.7 (*)    RBC 5.15 (*)    Hemoglobin 15.2 (*)    HCT 46.4 (*)    All other components within normal limits  EKG  EKG Interpretation  Date/Time:  Friday May 14 2016 17:49:02 EST Ventricular Rate:  115 PR Interval:    QRS Duration: 162 QT Interval:  332 QTC Calculation: 459 R Axis:   -54 Text Interpretation:  new  Atrial flutter with 2:1 A-V conduction Left axis deviation Left ventricular hypertrophy with QRS widening and repolarization abnormality Possible Lateral infarct , age undetermined Confirmed by Anitra Lauth  MD, Skilynn Durney ([id number]) on 05/14/2016 7:17:36 PM       Radiology Dg Chest 2 View  Result Date: 05/14/2016 CLINICAL DATA:  Initial evaluation for atrial fibrillation. EXAM: CHEST  2 VIEW COMPARISON:  Prior radiograph from 05/04/2005. FINDINGS: Mild cardiomegaly.  Mediastinal silhouette within normal limits. Aortic atherosclerosis noted. Elevation the right hemidiaphragm, similar to previous, likely chronic. No focal infiltrate, pulmonary edema, or pleural effusion. No pneumothorax. Possible minimal subsegmental atelectasis at the right lung base noted. No pneumothorax. No acute osseous abnormality. Multilevel degenerative spondylolysis noted within the visualized spine. IMPRESSION: 1. No active cardiopulmonary disease. 2. Chronic elevation of the right hemidiaphragm. 3. Aortic atherosclerosis. Electronically Signed   By: Rise Mu M.D.   On: 05/14/2016 19:02    Procedures Procedures (including critical care time)  Medications Ordered in ED Medications  diltiazem (CARDIZEM) 1 mg/mL load via infusion 10 mg (10 mg Intravenous Bolus from Bag 05/14/16 2057)    And  diltiazem (CARDIZEM) 100 mg in dextrose 5 % 100 mL (1 mg/mL) infusion (5 mg/hr Intravenous New Bag/Given 05/14/16 2059)     Initial Impression / Assessment and Plan / ED Course  I have reviewed the triage vital signs and the nursing notes.  Pertinent labs & imaging results that were available during my care of the patient were reviewed by me and considered in my medical decision making (see chart for details).     Patient is an elderly female with new onset of atrial fibrillation. Unclear when symptoms started it could've been as many as 6 days ago things seem to get worse 2 days ago. Found to be in A. fib RVR at the PCP clinic today and was sent here for further evaluation and care. Patient is in no acute distress but EKG shows atrial fibrillation with RVR.  She currently is chest pain-free. X-ray without signs of fluid overload. Labs without acute findings. Start patient on Cardizem. Chadsvasc of 5 making her high risk requiring anticoagulation.  However pt will also need echo to eval for thrombus if they plan on cardioverting her as sx onset is unknown.  CRITICAL CARE Performed by:  Gwyneth Sprout Total critical care time: 30 minutes Critical care time was exclusive of separately billable procedures and treating other patients. Critical care was necessary to treat or prevent imminent or life-threatening deterioration. Critical care was time spent personally by me on the following activities: development of treatment plan with patient and/or surrogate as well as nursing, discussions with consultants, evaluation of patient's response to treatment, examination of patient, obtaining history from patient or surrogate, ordering and performing treatments and interventions, ordering and review of laboratory studies, ordering and review of radiographic studies, pulse oximetry and re-evaluation of patient's condition.   Final Clinical Impressions(s) / ED Diagnoses   Final diagnoses:  None    New Prescriptions New Prescriptions   No medications on file     Gwyneth Sprout, MD 05/14/16 2207

## 2016-05-14 NOTE — H&P (Signed)
History and Physical    Alexandra Green GZF:[id number] DOB: December 27, 1940 DOA: 05/14/2016  PCP: Elyn Peers, MD  Patient coming from: Home  Chief Complaint: SOB X 6 days  HPI: Alexandra Green is a 76 y.o. female with medical history significant of HTN, DM, arthritis who presents for SOB for about 6 days.  She first noticed this symptom when she was coming down the steps from church on Sunday and she was SOB and had to stop to rest.  This is not normal for her.  Intermittently throughout the week, she would have SOB and also have episodes of palpitations and feeling like her heart was flip-flopping.  She has had no preceding illness except a mild cough.  She has not changed her diet and drinks only one cup of coffee day and decaffeinated tea at night.  This is not new for her.  No new medications, supplements or energy drinks.  She denies lightheadedness, chest pain, dizziness.  She continues to sleep with the same number of pillows and has no PND.  She does have some mild swelling in her legs and some chronic mild constipation.  She was found in clinic to be in Afib with RVR and sent to the ED.    ED Course: She was found to be in A flutter with RVR, she was initiated on a cardizem drip with good control of her HR.  She was in no acute distress.  She was noted to have a WBC of 11.7 which is not well explained.  She further had a TSH of 1.711.    Review of Systems: As per HPI otherwise 10 point review of systems negative.   Past Medical History:  Diagnosis Date  . Arthritis   . Diabetes mellitus without complication ([place])   . Hypertension     Past Surgical History:  Procedure Laterality Date  . ABDOMINAL HYSTERECTOMY    . CHOLECYSTECTOMY    . JOINT REPLACEMENT       reports that she has never smoked. She has never used smokeless tobacco. She reports that she does not drink alcohol or use drugs.  No Known Allergies  Family History  Problem Relation Age of Onset  . Heart attack  Mother   . Atrial fibrillation Neg Hx      Prior to Admission medications   Medication Sig Start Date End Date Taking? Authorizing Provider  amLODipine (NORVASC) 10 MG tablet Take 10 mg by mouth daily.   Yes Historical Provider, MD  aspirin EC 81 MG tablet Take 81 mg by mouth 2 (two) times daily.   Yes Historical Provider, MD  glimepiride (AMARYL) 4 MG tablet Take 4 mg by mouth daily with breakfast.   Yes Historical Provider, MD  lisinopril-hydrochlorothiazide (PRINZIDE,ZESTORETIC) 20-25 MG tablet Take 1 tablet by mouth daily.   Yes Historical Provider, MD  metFORMIN (GLUCOPHAGE) 500 MG tablet Take 500 mg by mouth 2 (two) times daily with a meal.   Yes Historical Provider, MD  Multiple Vitamin (MULTIVITAMIN WITH MINERALS) TABS tablet Take 2 tablets by mouth daily.   Yes Historical Provider, MD  Omega-3 Fatty Acids (FISH OIL) 1000 MG CAPS Take 2 capsules by mouth daily.   Yes Historical Provider, MD    Physical Exam: Vitals:   05/14/16 2000 05/14/16 2054 05/14/16 2200 05/14/16 2300  BP: 97/64 125/79 110/69 111/60  Pulse: 111 110 78 76  Resp: '23 20 20 17  ' Temp:      TempSrc:  SpO2: 98% 97% 94% 97%    Constitutional: NAD, somewhat nervous but conversant.  Vitals:   05/14/16 2000 05/14/16 2054 05/14/16 2200 05/14/16 2300  BP: 97/64 125/79 110/69 111/60  Pulse: 111 110 78 76  Resp: '23 20 20 17  ' Temp:      TempSrc:      SpO2: 98% 97% 94% 97%   Eyes: PERRL, lids and conjunctivae normal ENMT: Mucous membranes are moist. Posterior pharynx clear of any exudate or lesions. Neck: normal, supple, no masses, no thyromegaly Respiratory: clear to auscultation bilaterally, no wheezing, no crackles. Normal respiratory effort. No accessory muscle use.  Cardiovascular: Irreg Irreg rhythm and normal rate, no murmurs / rubs / gallops. Mild non pitting edema to shins, ankles Abdomen: no tenderness, no masses palpated.  Bowel sounds positive.  Musculoskeletal: No joint deformity upper and  lower extremities. Normal muscle tone.  Skin: no rashes, lesions, ulcers. Neurologic: CN 2-12 grossly intact. Sensation intact.  Moving all extremities equally.  Grip strength is 5/5 bilaterally  Psychiatric: Normal judgment and insight. Alert and oriented x 3. Anxious mood.    Labs on Admission: I have personally reviewed following labs and imaging studies  CBC:  Recent Labs Lab 05/14/16 1800  WBC 11.7*  HGB 15.2*  HCT 46.4*  MCV 90.1  PLT 696   Basic Metabolic Panel:  Recent Labs Lab 05/14/16 1800  NA 141  K 4.7  CL 101  CO2 27  GLUCOSE 111*  BUN 18  CREATININE 0.83  CALCIUM 9.7   Thyroid Function Tests:  Recent Labs  05/14/16 2053  TSH 1.711     Radiological Exams on Admission: Dg Chest 2 View  Result Date: 05/14/2016 CLINICAL DATA:  Initial evaluation for atrial fibrillation. EXAM: CHEST  2 VIEW COMPARISON:  Prior radiograph from 05/04/2005. FINDINGS: Mild cardiomegaly. Mediastinal silhouette within normal limits. Aortic atherosclerosis noted. Elevation the right hemidiaphragm, similar to previous, likely chronic. No focal infiltrate, pulmonary edema, or pleural effusion. No pneumothorax. Possible minimal subsegmental atelectasis at the right lung base noted. No pneumothorax. No acute osseous abnormality. Multilevel degenerative spondylolysis noted within the visualized spine. IMPRESSION: 1. No active cardiopulmonary disease. 2. Chronic elevation of the right hemidiaphragm. 3. Aortic atherosclerosis. Electronically Signed   By: Jeannine Boga M.D.   On: 05/14/2016 19:02    EKG: Independently reviewed. A flutter with 2:1 conduction.    Assessment/Plan  Atrial flutter with RVR - EKG with flutter and this appears consistent with telemetry findings - Rate controlled with cardizem ggt, however, with activity she did become tachycardic again - Admit to SDU while on drip, monitor on telemetry, transition to oral cardizem once goal of drip met - EKG in the  AM - Consider cardiology consult - relative new onset, may cardiovert - Given high CHADS-VASC (5) - have started eliquis and I discussed this with the patient.  - No chest pain, so did not trend troponin.  Would do so if she developed chest pain    Hypertension - Held home amlodipine and lisinopril-hctz, she is now normotensive and will be starting diltiazem.  Restart one if BP elevated    Diabetes mellitus without complication - Hold glimepiride, continue metformin - SSI  Leukocytosis - Unknown baseline, trend in the AM with differential.    DVT prophylaxis: Eliquis Code Status: Full Family Communication: Daughter and husband at bedside Disposition Plan: Pending HR improvement  Admission status: SDU/Telemetry   Gilles Chiquito MD [facility] Pager [phone]  If 7PM-7AM, please contact night-coverage [email or web address]  Password [email or web address]  05/15/2016, 12:09 AM

## 2016-05-14 NOTE — ED Notes (Signed)
Attempted report x1. 

## 2016-05-14 NOTE — ED Triage Notes (Signed)
Pt presents with c/o atrial fibrillation. [facility] was sent [facility] for EKG showing afib with RVR.She went to [facility] today for evaluation of SOB with exertion and feeling her heart fluttering intermittently for the past 2 days. She denies any pain. She denies any history of afib.

## 2016-05-14 NOTE — ED Notes (Signed)
Received call from patient's PCP.  States patient showed an irregular heart beat on her EKG in office after presenting for SOB with exertion.  EKG and SpO2 done in office and patient transferred to ED via POV for further work up.  HR was 112 at office.

## 2016-05-15 ENCOUNTER — Encounter (HOSPITAL_COMMUNITY): Payer: Self-pay | Admitting: Internal Medicine

## 2016-05-15 DIAGNOSIS — I483 Typical atrial flutter: Secondary | ICD-10-CM

## 2016-05-15 DIAGNOSIS — D72829 Elevated white blood cell count, unspecified: Secondary | ICD-10-CM

## 2016-05-15 DIAGNOSIS — I4891 Unspecified atrial fibrillation: Secondary | ICD-10-CM

## 2016-05-15 DIAGNOSIS — E119 Type 2 diabetes mellitus without complications: Secondary | ICD-10-CM

## 2016-05-15 DIAGNOSIS — I10 Essential (primary) hypertension: Secondary | ICD-10-CM | POA: Diagnosis present

## 2016-05-15 HISTORY — DX: Elevated white blood cell count, unspecified: D72.829

## 2016-05-15 HISTORY — DX: Unspecified atrial fibrillation: I48.91

## 2016-05-15 LAB — GLUCOSE, CAPILLARY
Glucose-Capillary: 206 mg/dL — ABNORMAL HIGH (ref 65–99)
Glucose-Capillary: 131 mg/dL — ABNORMAL HIGH (ref 65–99)
Glucose-Capillary: 118 mg/dL — ABNORMAL HIGH (ref 65–99)

## 2016-05-15 LAB — BASIC METABOLIC PANEL
Anion gap: 12 (ref 5–15)
BUN: 15 mg/dL (ref 6–20)
CO2: 25 mmol/L (ref 22–32)
Calcium: 9 mg/dL (ref 8.9–10.3)
Chloride: 104 mmol/L (ref 101–111)
Creatinine, Ser: 0.8 mg/dL (ref 0.44–1.00)
GFR calc Af Amer: >60 mL/min (ref >60)
GFR calc non Af Amer: >60 mL/min (ref >60)
Glucose, Bld: 110 mg/dL — ABNORMAL HIGH (ref 65–99)
Potassium: 3.7 mmol/L (ref 3.5–5.1)
Sodium: 141 mmol/L (ref 135–145)

## 2016-05-15 LAB — CBC WITH DIFFERENTIAL/PLATELET
Basophils Absolute: 0 10*3/uL (ref 0.0–0.1)
Basophils Relative: 0 %
Eosinophils Absolute: 0.1 10*3/uL (ref 0.0–0.7)
Eosinophils Relative: 1 %
HCT: 41.9 % (ref 36.0–46.0)
Hemoglobin: 13.6 g/dL (ref 12.0–15.0)
Lymphocytes Relative: 23 %
Lymphs Abs: 2.5 10*3/uL (ref 0.7–4.0)
MCH: 29 pg (ref 26.0–34.0)
MCHC: 32.5 g/dL (ref 30.0–36.0)
MCV: 89.3 fL (ref 78.0–100.0)
Monocytes Absolute: 1 10*3/uL (ref 0.1–1.0)
Monocytes Relative: 9 %
Neutro Abs: 7.7 10*3/uL (ref 1.7–7.7)
Neutrophils Relative %: 67 %
Platelets: 328 10*3/uL (ref 150–400)
RBC: 4.69 MIL/uL (ref 3.87–5.11)
RDW: 15.7 % — ABNORMAL HIGH (ref 11.5–15.5)
WBC: 11.3 10*3/uL — ABNORMAL HIGH (ref 4.0–10.5)

## 2016-05-15 LAB — CBG MONITORING, ED
Glucose-Capillary: 117 mg/dL — ABNORMAL HIGH (ref 65–99)
Glucose-Capillary: 118 mg/dL — ABNORMAL HIGH (ref 65–99)

## 2016-05-15 MED ORDER — METFORMIN HCL 500 MG PO TABS
500.0000 mg | ORAL_TABLET | Freq: Two times a day (BID) | ORAL | Status: DC
  Administered 2016-05-15 – 2016-05-18 (×5): 500 mg via ORAL
  Filled 2016-05-15 (×6): qty 1

## 2016-05-15 MED ORDER — DILTIAZEM HCL 30 MG PO TABS
30.0000 mg | ORAL_TABLET | Freq: Four times a day (QID) | ORAL | Status: DC
  Administered 2016-05-15 (×2): 30 mg via ORAL
  Filled 2016-05-15 (×4): qty 1

## 2016-05-15 MED ORDER — INSULIN ASPART 100 UNIT/ML ~~LOC~~ SOLN: 0.0000 [IU] | Freq: Every day | SUBCUTANEOUS | Status: DC

## 2016-05-15 MED ORDER — ACETAMINOPHEN 325 MG PO TABS: 650.0000 mg | ORAL_TABLET | ORAL | Status: DC | PRN

## 2016-05-15 MED ORDER — OMEGA-3-ACID ETHYL ESTERS 1 G PO CAPS
2.0000 g | ORAL_CAPSULE | Freq: Every day | ORAL | Status: DC
  Administered 2016-05-15 – 2016-05-18 (×4): 2 g via ORAL
  Filled 2016-05-15 (×6): qty 2

## 2016-05-15 MED ORDER — DEXTROSE 5 % IV SOLN: 5.0000 mg/h | INTRAVENOUS | Status: DC

## 2016-05-15 MED ORDER — DILTIAZEM HCL ER COATED BEADS 240 MG PO CP24
240.0000 mg | ORAL_CAPSULE | Freq: Every day | ORAL | Status: DC
  Administered 2016-05-15 – 2016-05-16 (×2): 240 mg via ORAL
  Filled 2016-05-15 (×2): qty 1

## 2016-05-15 MED ORDER — INSULIN ASPART 100 UNIT/ML ~~LOC~~ SOLN
0.0000 [IU] | Freq: Three times a day (TID) | SUBCUTANEOUS | Status: DC
  Administered 2016-05-15: 5 [IU] via SUBCUTANEOUS
  Administered 2016-05-15 – 2016-05-17 (×4): 2 [IU] via SUBCUTANEOUS

## 2016-05-15 MED ORDER — ONDANSETRON HCL 4 MG/2ML IJ SOLN: 4.0000 mg | Freq: Four times a day (QID) | INTRAMUSCULAR | Status: DC | PRN

## 2016-05-15 MED ORDER — ADULT MULTIVITAMIN W/MINERALS CH
2.0000 | ORAL_TABLET | Freq: Every day | ORAL | Status: DC
  Administered 2016-05-15 – 2016-05-18 (×4): 2 via ORAL
  Filled 2016-05-15 (×4): qty 2

## 2016-05-15 MED ORDER — APIXABAN 5 MG PO TABS
5.0000 mg | ORAL_TABLET | Freq: Two times a day (BID) | ORAL | Status: DC
  Administered 2016-05-15 – 2016-05-18 (×8): 5 mg via ORAL
  Filled 2016-05-15 (×8): qty 1

## 2016-05-15 NOTE — ED Notes (Signed)
Report given to TY

## 2016-05-15 NOTE — Progress Notes (Signed)
Patient arrived on the floor around 1040 am alert and oriented, denies pain, no shortness of breath, VSS. Will continue to monitor the patient.

## 2016-05-15 NOTE — ED Notes (Addendum)
Pt was on bedside commode just before I came into the room, being assisted by her daughter. Noticed that the cannister on purple-top Sani-wipes was now sitting on the bedside table. I asked pt if she had used the Sani-wipes to clean herself after using the commode. She responded that she had used them. I instructed pt that the Sani-wipes were NOT for use on human skin, but hard surfaces only. Pt voiced understanding and I placed the Sani-wipes back in the corner of the room where they are normally kept.

## 2016-05-15 NOTE — Progress Notes (Signed)
Pt is alert and oriented with no pain with family at the bedside HR A-Fib and controls 80-100. Taking eliquis Vitals sign stable

## 2016-05-15 NOTE — ED Notes (Signed)
RN contacted Admitting about patient moving to Telemetry patient HR in 50's and BP 106/75.Patient denies Chest Pain. Cardizem drip stop.

## 2016-05-15 NOTE — ED Notes (Signed)
Admitting text page . RN requested MD to place new bed request.

## 2016-05-15 NOTE — ED Notes (Signed)
Pt aware of bed status no complaints noted at this time 

## 2016-05-15 NOTE — Progress Notes (Signed)
PROGRESS NOTE    Alexandra Green  ZSW:[id number] DOB: [birth year] DOA: 05/14/2016 PCP: Geraldo Pitter, MD   Brief Narrative:  Alexandra Green is a 76 y.o. female with medical history significant of HTN, DM, arthritis who presents for SOB for about 6 days.  She first noticed this symptom when she was coming down the steps from church on Sunday and she was SOB and had to stop to rest.  This is not normal for her. Intermittently throughout the week, she would have SOB and also have episodes of palpitations and feeling like her heart was flip-flopping.  She has had no preceding illness except a mild cough.  She has not changed her diet and drinks only one cup of coffee day and decaffeinated tea at night.  This is not new for her.  No new medications, supplements or energy drinks.  She denies lightheadedness, chest pain, dizziness.  She continues to sleep with the same number of pillows and has no PND.  She does have some mild swelling in her legs and some chronic mild constipation.  She was found in clinic to be in Afib with RVR and sent to the Ed. Pateint was noted to be in A Flutter with RVR in the ED and placed on Cardizem gtt and admitted to the Hospital. When asked patient states she can feel her heart rate get irregular.   Assessment & Plan:   Principal Problem:   New onset atrial fibrillation (HCC) Active Problems:   Atrial flutter (HCC)   Essential hypertension   Diabetes mellitus without complication (HCC)   Leukocytosis  New Onset Atrial Flutter/Fibrillation with RVR, improving - EKG with flutter and this appears consistent with telemetry findings - Rate controlled with cardizem ggt, however, with activity she did become tachycardic again - Continue Telemetry; Cardizem gtt Discontinued - Patient initially placed on 30 mg of Cardizem q6h however switched to Long Acting Cardizem 240 mg po Daily - Obtain ECHOcardiogram - EKG in the AM - Discussed with Cardiology Dr. Shirlee Latch - relative  new onset, may cardiovert on Tuesday if she remains in the Hospital. If still need to see the patient will see patient tomorrow AM - Given high CHADS-VASC (5) - Patient started on Eliquis 5 mg po BID - No chest pain, so did not trend troponin.  Would do so if she developed chest pain - Continue to Monitor and possibly add Beta Blocker  Mild Leukocytosis -No evidence of Infection and Unknown Baseline -WBC went from 11.7 -> 11.3 -Monitor for S/Sx of Infection -Repeat CBC in AM  Hypertension -Home Amlodipine 10 mg and Lisinopril-HCTZ 20-25 mg held as patient was started on Diltazem gtt -Will likely restart if BP starts elevating again -Continue to Monitor and Long Acting Continue Diltiazem 240 mg po Daily  Non-Insulin Dependent Diabetes Mellitus -Home Meds of Glimperide and Metformin -C/w Metformin 500 mg po BID and hold Glimpieride -Started on Moderate Novolog SSI AC and HS -CBG's have been running from 117-206  Osteoarthritis of the Knees -C/w Acetaminophen 650 mg po q4hprn for Mild Pain  Morbid Obesity -BMI 42.7 -Weight Loss Counseling given  DVT prophylaxis: Anticoagulated with Apxiaban Code Status: FULL CODE Family Communication: Discussed with Daughter at bedside Disposition Plan: Possibly D/C Home in AM  Consultants:   Discussed with Cardiologist Dr. Shirlee Latch via Telephone   Procedures: ECHOCardiogram   Antimicrobials:  Anti-infectives    None     Subjective: Seen and examined and was doing better. No CP and SOB improved.  States she can feel her heart when it is irregular  Objective: Vitals:   05/15/16 0930 05/15/16 1000 05/15/16 1046 05/15/16 1544  BP: 114/79 116/71 103/64   Pulse:  73 (!) 109 (!) 106  Resp:  20 18   Temp:   98.1 F (36.7 C)   TempSrc:   Oral   SpO2: 95% 93% 96%   Weight:   119.8 kg (264 lb 3.2 oz)   Height:   5\' 6"  (1.676 m)     Intake/Output Summary (Last 24 hours) at 05/15/16 1643 Last data filed at 05/15/16 0438  Gross per 24  hour  Intake            44.92 ml  Output                0 ml  Net            44.92 ml   Filed Weights   05/15/16 1046  Weight: 119.8 kg (264 lb 3.2 oz)    Examination: Physical Exam:  Constitutional: WN/WD obese , NAD and appears calm and comfortable Eyes: Lids and conjunctivae normal, sclerae anicteric  ENMT: External Ears, Nose appear normal. Grossly normal hearing.  Neck: Appears normal, supple, no cervical masses, normal ROM, no appreciable thyromegaly, no JVD Respiratory: Clear to auscultation bilaterally, no wheezing, rales, rhonchi or crackles. Normal respiratory effort and patient is not tachypenic. No accessory muscle use.  Cardiovascular: Irregularly Irregular, no murmurs / rubs / gallops. S1 and S2 auscultated. No extremity edema.  Abdomen: Soft, non-tender, non-distended. No masses palpated. No appreciable hepatosplenomegaly. Bowel sounds positive.  GU: Deferred. Musculoskeletal: No clubbing / cyanosis of digits/nails. No joint deformity upper and lower extremities. No Contractures  Skin: No rashes, lesions, ulcers on limited skin evaluation. No induration; Warm and dry.  Neurologic: CN 2-12 grossly intact with no focal deficits. Sensation intact in all 4 Extremities. Romberg sign cerebellar reflexes not assessed.  Psychiatric: Normal judgment and insight. Alert and oriented x 3. Normal mood and appropriate affect.   Data Reviewed: I have personally reviewed following labs and imaging studies  CBC:  Recent Labs Lab 05/14/16 1800 05/15/16 0320  WBC 11.7* 11.3*  NEUTROABS  --  7.7  HGB 15.2* 13.6  HCT 46.4* 41.9  MCV 90.1 89.3  PLT 346 328   Basic Metabolic Panel:  Recent Labs Lab 05/14/16 1800 05/15/16 0320  NA 141 141  K 4.7 3.7  CL 101 104  CO2 27 25  GLUCOSE 111* 110*  BUN 18 15  CREATININE 0.83 0.80  CALCIUM 9.7 9.0   GFR: Estimated Creatinine Clearance: 80.1 mL/min (by C-G formula based on SCr of 0.8 mg/dL). Liver Function Tests: No results  for input(s): AST, ALT, ALKPHOS, BILITOT, PROT, ALBUMIN in the last 168 hours. No results for input(s): LIPASE, AMYLASE in the last 168 hours. No results for input(s): AMMONIA in the last 168 hours. Coagulation Profile: No results for input(s): INR, PROTIME in the last 168 hours. Cardiac Enzymes: No results for input(s): CKTOTAL, CKMB, CKMBINDEX, TROPONINI in the last 168 hours. BNP (last 3 results) No results for input(s): PROBNP in the last 8760 hours. HbA1C: No results for input(s): HGBA1C in the last 72 hours. CBG:  Recent Labs Lab 05/15/16 0347 05/15/16 0800 05/15/16 1117  GLUCAP 117* 118* 206*   Lipid Profile: No results for input(s): CHOL, HDL, LDLCALC, TRIG, CHOLHDL, LDLDIRECT in the last 72 hours. Thyroid Function Tests:  Recent Labs  05/14/16 2053  TSH 1.711  Anemia Panel: No results for input(s): VITAMINB12, FOLATE, FERRITIN, TIBC, IRON, RETICCTPCT in the last 72 hours. Sepsis Labs: No results for input(s): PROCALCITON, LATICACIDVEN in the last 168 hours.  No results found for this or any previous visit (from the past 240 hour(s)).   Radiology Studies: Dg Chest 2 View  Result Date: 05/14/2016 CLINICAL DATA:  Initial evaluation for atrial fibrillation. EXAM: CHEST  2 VIEW COMPARISON:  Prior radiograph from 05/04/2005. FINDINGS: Mild cardiomegaly. Mediastinal silhouette within normal limits. Aortic atherosclerosis noted. Elevation the right hemidiaphragm, similar to previous, likely chronic. No focal infiltrate, pulmonary edema, or pleural effusion. No pneumothorax. Possible minimal subsegmental atelectasis at the right lung base noted. No pneumothorax. No acute osseous abnormality. Multilevel degenerative spondylolysis noted within the visualized spine. IMPRESSION: 1. No active cardiopulmonary disease. 2. Chronic elevation of the right hemidiaphragm. 3. Aortic atherosclerosis. Electronically Signed   By: Rise Mu M.D.   On: 05/14/2016 19:02   Scheduled  Meds: . apixaban  5 mg Oral BID  . diltiazem  240 mg Oral Daily  . insulin aspart  0-15 Units Subcutaneous TID WC  . insulin aspart  0-5 Units Subcutaneous QHS  . metFORMIN  500 mg Oral BID WC  . multivitamin with minerals  2 tablet Oral Daily  . omega-3 acid ethyl esters  2 g Oral Daily   Continuous Infusions:   LOS: 1 day   Merlene Laughter, DO [facility] Pager [phone]  If 7PM-7AM, please contact night-coverage [email or web address] Password [facility] 05/15/2016, 4:43 PM

## 2016-05-15 NOTE — ED Notes (Signed)
Admitting at bedside 

## 2016-05-15 NOTE — ED Notes (Signed)
MD advised patient to go to Telemetry

## 2016-05-15 NOTE — ED Notes (Signed)
Pt sitting on side of bed and eating breakfast

## 2016-05-15 NOTE — ED Notes (Signed)
BP cuff switched to regular size cuff and readjusted. BP improved.

## 2016-05-16 ENCOUNTER — Inpatient Hospital Stay (HOSPITAL_COMMUNITY): Payer: Medicare Other

## 2016-05-16 ENCOUNTER — Encounter (HOSPITAL_COMMUNITY): Payer: Self-pay | Admitting: Cardiology

## 2016-05-16 DIAGNOSIS — I4891 Unspecified atrial fibrillation: Secondary | ICD-10-CM

## 2016-05-16 LAB — CBC WITH DIFFERENTIAL/PLATELET
Basophils Absolute: 0 10*3/uL (ref 0.0–0.1)
Basophils Relative: 0 %
Eosinophils Absolute: 0.1 10*3/uL (ref 0.0–0.7)
Eosinophils Relative: 1 %
HCT: 41.1 % (ref 36.0–46.0)
Hemoglobin: 13.3 g/dL (ref 12.0–15.0)
Lymphocytes Relative: 29 %
Lymphs Abs: 3.5 10*3/uL (ref 0.7–4.0)
MCH: 28.9 pg (ref 26.0–34.0)
MCHC: 32.4 g/dL (ref 30.0–36.0)
MCV: 89.2 fL (ref 78.0–100.0)
Monocytes Absolute: 1 10*3/uL (ref 0.1–1.0)
Monocytes Relative: 8 %
Neutro Abs: 7.4 10*3/uL (ref 1.7–7.7)
Neutrophils Relative %: 62 %
Platelets: 345 10*3/uL (ref 150–400)
RBC: 4.61 MIL/uL (ref 3.87–5.11)
RDW: 15.4 % (ref 11.5–15.5)
WBC: 12 10*3/uL — ABNORMAL HIGH (ref 4.0–10.5)

## 2016-05-16 LAB — COMPREHENSIVE METABOLIC PANEL
ALT: 18 U/L (ref 14–54)
AST: 20 U/L (ref 15–41)
Albumin: 3.2 g/dL — ABNORMAL LOW (ref 3.5–5.0)
Alkaline Phosphatase: 53 U/L (ref 38–126)
Anion gap: 9 (ref 5–15)
BUN: 24 mg/dL — ABNORMAL HIGH (ref 6–20)
CO2: 27 mmol/L (ref 22–32)
Calcium: 8.9 mg/dL (ref 8.9–10.3)
Chloride: 103 mmol/L (ref 101–111)
Creatinine, Ser: 0.96 mg/dL (ref 0.44–1.00)
GFR calc Af Amer: >60 mL/min (ref >60)
GFR calc non Af Amer: 56 mL/min — ABNORMAL LOW (ref >60)
Glucose, Bld: 126 mg/dL — ABNORMAL HIGH (ref 65–99)
Potassium: 3.9 mmol/L (ref 3.5–5.1)
Sodium: 139 mmol/L (ref 135–145)
Total Bilirubin: 0.5 mg/dL (ref 0.3–1.2)
Total Protein: 6.5 g/dL (ref 6.5–8.1)

## 2016-05-16 LAB — HEMOGLOBIN A1C
Hgb A1c MFr Bld: 6.3 % — ABNORMAL HIGH (ref 4.8–5.6)
Mean Plasma Glucose: 134 mg/dL

## 2016-05-16 LAB — GLUCOSE, CAPILLARY
Glucose-Capillary: 143 mg/dL — ABNORMAL HIGH (ref 65–99)
Glucose-Capillary: 96 mg/dL (ref 65–99)
Glucose-Capillary: 143 mg/dL — ABNORMAL HIGH (ref 65–99)
Glucose-Capillary: 101 mg/dL — ABNORMAL HIGH (ref 65–99)

## 2016-05-16 LAB — MAGNESIUM: Magnesium: 1.8 mg/dL (ref 1.7–2.4)

## 2016-05-16 LAB — PHOSPHORUS: Phosphorus: 4.2 mg/dL (ref 2.5–4.6)

## 2016-05-16 MED ORDER — DILTIAZEM HCL ER COATED BEADS 120 MG PO CP24
120.0000 mg | ORAL_CAPSULE | Freq: Every day | ORAL | Status: DC
  Administered 2016-05-17 – 2016-05-18 (×2): 120 mg via ORAL
  Filled 2016-05-16 (×2): qty 1

## 2016-05-16 MED ORDER — SODIUM CHLORIDE 0.9 % IV SOLN: INTRAVENOUS | Status: DC

## 2016-05-16 NOTE — Progress Notes (Signed)
Pt is scheduled for TEE cardioversion tomorrow, NPO since midnight, consent has been taken, pt is aware of the procedure, will continue to monitor the patient.

## 2016-05-16 NOTE — Progress Notes (Signed)
PROGRESS NOTE    Alexandra Green  ZOX:[id number] DOB: 1940/04/21 DOA: 05/14/2016 PCP: Geraldo Pitter, MD   Brief Narrative:  Alexandra Green is a 76 y.o. female with medical history significant of HTN, DM, arthritis who presents for SOB for about 6 days.  She first noticed this symptom when she was coming down the steps from church on Sunday and she was SOB and had to stop to rest.  This is not normal for her. Intermittently throughout the week, she would have SOB and also have episodes of palpitations and feeling like her heart was flip-flopping.  She has had no preceding illness except a mild cough.  She has not changed her diet and drinks only one cup of coffee day and decaffeinated tea at night.  This is not new for her.  No new medications, supplements or energy drinks.  She denies lightheadedness, chest pain, dizziness.  She continues to sleep with the same number of pillows and has no PND.  She does have some mild swelling in her legs and some chronic mild constipation.  She was found in clinic to be in Afib with RVR and sent to the Ed. Pateint was noted to be in A Flutter with RVR in the ED and placed on Cardizem gtt and admitted to the Hospital. When asked patient states she can feel her heart rate get irregular. Patient is off Cardizem gtt and now on oral Diltiazem and Cardiology saw and evaluated patient is likely to get TEE/DCCV tomorrow.   Assessment & Plan:   Principal Problem:   New onset atrial flutter (HCC) Active Problems:   Essential hypertension   Diabetes mellitus without complication (HCC)   Leukocytosis  New Onset Atrial Flutter with 2:1 and 3:1 Conduction -Had Rapid Ventricular Response yesterday -EKG with flutter and this appears consistent with telemetry findings -Rate controlled with cardizem ggt, however, with activity she did become tachycardic again -Continue Telemetry; Cardizem gtt Discontinued -Patient initially placed on 30 mg of Cardizem q6h however switched  to Long Acting Cardizem 240 mg po Daily; However changed to 120 mg po Daily -Obtain ECHOcardiogram (pending) -EKG this AM still not done -Discussed with Cardiology Dr. Shirlee Latch yesterday - relative new onset, may cardiovert on Tuesday if she remains in the Hospital. Dr. Antoine Poche evaluated patient and appreciate Recc's -Given high CHADS-VASC (5) - Patient started on Eliquis 5 mg po BID -No chest pain, so did not trend troponin.  Would do so if she developed chest pain -Continue to Monitor and possibly add Beta Blocker in uncontrolled -Cardiology to set patient up with TEE/DCCV for tomorrow likely  Mild Leukocytosis -No evidence of Infection and Unknown Baseline -WBC went from 11.7 -> 11.3 -> 12.0 -Monitor for S/Sx of Infection; States she was coughing yesterday but not productive -Repeat CBC in AM  Hypertension -Home Amlodipine 10 mg and Lisinopril-HCTZ 20-25 mg held as patient was started on Diltazem gtt -Will likely restart if BP starts elevating again however BP on the lower side and controlled -Continue to Monitor and Long Acting Continue Diltiazem 240 mg po Daily changed to 120 mg po Daily  Non-Insulin Dependent Diabetes Mellitus -Home Meds of Glimperide and Metformin -Hb A1c was 6.3 -C/w Metformin 500 mg po BID and hold Glimpieride -Started on Moderate Novolog SSI AC and HS -CBG's have been running from 96-143  Osteoarthritis of the Knees -C/w Acetaminophen 650 mg po q4hprn for Mild Pain  Morbid Obesity -BMI 42.7 -Weight Loss Counseling given  DVT prophylaxis: Anticoagulated with  Apxiaban Code Status: FULL CODE Family Communication: Discussed with Daughter at bedside Disposition Plan: Possibly D/C Home in AM  Consultants:   Discussed with Cardiologist Dr. Shirlee Latch via Telephone yesterday  Dr. Antoine Poche of [facility] saw patient today.    Procedures: ECHOCardiogram   Antimicrobials:  Anti-infectives    None     Subjective: Seen and examined was doing ok. No  Chest Pain or SOB today. No Nausea or Vomiting. SOB has resolved.   Objective: Vitals:   05/15/16 2059 05/16/16 0037 05/16/16 0513 05/16/16 1216  BP: (!) 101/58 (!) 102/55 (!) 102/56 (!) 118/58  Pulse: (!) 53 (!) 57 (!) 56 (!) 57  Resp: 20 18 16 18   Temp: 98.8 F (37.1 C) 98.8 F (37.1 C) 98 F (36.7 C) 98 F (36.7 C)  TempSrc: Oral Oral Oral Oral  SpO2: 96% 95% 97% 98%  Weight:   120.5 kg (265 lb 11.2 oz)   Height:        Intake/Output Summary (Last 24 hours) at 05/16/16 1329 Last data filed at 05/16/16 1100  Gross per 24 hour  Intake              700 ml  Output             1800 ml  Net            -1100 ml   Filed Weights   05/15/16 1046 05/16/16 0513  Weight: 119.8 kg (264 lb 3.2 oz) 120.5 kg (265 lb 11.2 oz)    Examination: Physical Exam:  Constitutional: WN/WD obese , NAD and appears calm and comfortable Eyes: Lids and conjunctivae normal, sclerae anicteric  ENMT: External Ears, Nose appear normal. Grossly normal hearing.  Neck: Appears normal, supple, no cervical masses, normal ROM, no appreciable thyromegaly, no JVD Respiratory: Clear to auscultation bilaterally, no wheezing, rales, rhonchi or crackles. Normal respiratory effort and patient is not tachypenic. No accessory muscle use.  Cardiovascular: Irregularly rhythm and normal rate, no murmurs / rubs / gallops. S1 and S2 auscultated. No extremity edema.  Abdomen: Soft, non-tender, non-distended. No masses palpated. No appreciable hepatosplenomegaly. Bowel sounds positive.  GU: Deferred. Musculoskeletal: No clubbing / cyanosis of digits/nails. No joint deformity upper and lower extremities. No Contractures  Skin: No rashes, lesions, ulcers on limited skin evaluation. No induration; Warm and dry.  Neurologic: CN 2-12 grossly intact with no focal deficits. Sensation intact in all 4 Extremities. Romberg sign cerebellar reflexes not assessed.  Psychiatric: Normal judgment and insight. Alert and oriented x 3. Normal  mood and appropriate affect.   Data Reviewed: I have personally reviewed following labs and imaging studies  CBC:  Recent Labs Lab 05/14/16 1800 05/15/16 0320 05/16/16 0411  WBC 11.7* 11.3* 12.0*  NEUTROABS  --  7.7 7.4  HGB 15.2* 13.6 13.3  HCT 46.4* 41.9 41.1  MCV 90.1 89.3 89.2  PLT 346 328 345   Basic Metabolic Panel:  Recent Labs Lab 05/14/16 1800 05/15/16 0320 05/16/16 0411  NA 141 141 139  K 4.7 3.7 3.9  CL 101 104 103  CO2 27 25 27   GLUCOSE 111* 110* 126*  BUN 18 15 24*  CREATININE 0.83 0.80 0.96  CALCIUM 9.7 9.0 8.9  MG  --   --  1.8  PHOS  --   --  4.2   GFR: Estimated Creatinine Clearance: 67 mL/min (by C-G formula based on SCr of 0.96 mg/dL). Liver Function Tests:  Recent Labs Lab 05/16/16 0411  AST 20  ALT  18  ALKPHOS 53  BILITOT 0.5  PROT 6.5  ALBUMIN 3.2*   No results for input(s): LIPASE, AMYLASE in the last 168 hours. No results for input(s): AMMONIA in the last 168 hours. Coagulation Profile: No results for input(s): INR, PROTIME in the last 168 hours. Cardiac Enzymes: No results for input(s): CKTOTAL, CKMB, CKMBINDEX, TROPONINI in the last 168 hours. BNP (last 3 results) No results for input(s): PROBNP in the last 8760 hours. HbA1C:  Recent Labs  05/15/16 0320  HGBA1C 6.3*   CBG:  Recent Labs Lab 05/15/16 1117 05/15/16 1644 05/15/16 2105 05/16/16 0733 05/16/16 1135  GLUCAP 206* 131* 118* 143* 96   Lipid Profile: No results for input(s): CHOL, HDL, LDLCALC, TRIG, CHOLHDL, LDLDIRECT in the last 72 hours. Thyroid Function Tests:  Recent Labs  05/14/16 2053  TSH 1.711   Anemia Panel: No results for input(s): VITAMINB12, FOLATE, FERRITIN, TIBC, IRON, RETICCTPCT in the last 72 hours. Sepsis Labs: No results for input(s): PROCALCITON, LATICACIDVEN in the last 168 hours.  No results found for this or any previous visit (from the past 240 hour(s)).   Radiology Studies: Dg Chest 2 View  Result Date:  05/14/2016 CLINICAL DATA:  Initial evaluation for atrial fibrillation. EXAM: CHEST  2 VIEW COMPARISON:  Prior radiograph from 05/04/2005. FINDINGS: Mild cardiomegaly. Mediastinal silhouette within normal limits. Aortic atherosclerosis noted. Elevation the right hemidiaphragm, similar to previous, likely chronic. No focal infiltrate, pulmonary edema, or pleural effusion. No pneumothorax. Possible minimal subsegmental atelectasis at the right lung base noted. No pneumothorax. No acute osseous abnormality. Multilevel degenerative spondylolysis noted within the visualized spine. IMPRESSION: 1. No active cardiopulmonary disease. 2. Chronic elevation of the right hemidiaphragm. 3. Aortic atherosclerosis. Electronically Signed   By: Rise Mu M.D.   On: 05/14/2016 19:02   Scheduled Meds: . apixaban  5 mg Oral BID  . [START ON 05/17/2016] diltiazem  120 mg Oral Daily  . insulin aspart  0-15 Units Subcutaneous TID WC  . insulin aspart  0-5 Units Subcutaneous QHS  . metFORMIN  500 mg Oral BID WC  . multivitamin with minerals  2 tablet Oral Daily  . omega-3 acid ethyl esters  2 g Oral Daily   Continuous Infusions:   LOS: 2 days   Merlene Laughter, DO [facility] Pager [phone]  If 7PM-7AM, please contact night-coverage [email or web address] Password [email or web address] 05/16/2016, 1:29 PM

## 2016-05-16 NOTE — Consult Note (Signed)
CARDIOLOGY CONSULT NOTE  Patient ID: VIHA BARTOLOMEI MRN: [id number] DOB/AGE: 1940/09/17 76 y.o.  Admit date: 05/14/2016 Primary Physician Geraldo Pitter, MD Primary Cardiologist [facility]) Chief Complaint  Palpitations Requesting  Dr. Marland Mcalpine  HPI:   The patient has no past cardiac history.  She felt some weakness about a week ago and palpitations.  she took her pulse eventually and noted that it was irregular.  She went to see Dr. Parke Simmers on Friday and was noted to be in atrial flutter.  She came to the ED and was treated with IV Cardizem.  She is now on Eliquis and PO Cardizem.  Her rate is 2:1 and 3:1 predominantly while lying in bed.  The patient denies any new symptoms such as chest discomfort, neck or arm discomfort. There has been no new, PND or orthopnea. There have been no reported, presyncope or syncope.  She walks with a cane secondary to joint and back pain.     Past Medical History:  Diagnosis Date  . Arthritis   . Diabetes mellitus without complication (HCC)   . Hypertension     Past Surgical History:  Procedure Laterality Date  . ABDOMINAL HYSTERECTOMY    . CATARACT EXTRACTION    . CHOLECYSTECTOMY    . TOTAL KNEE ARTHROPLASTY Left     No Known Allergies Prescriptions Prior to Admission  Medication Sig Dispense Refill Last Dose  . amLODipine (NORVASC) 10 MG tablet Take 10 mg by mouth daily.   05/14/2016 at Unknown time  . aspirin EC 81 MG tablet Take 81 mg by mouth 2 (two) times daily.   05/13/2016 at Unknown time  . glimepiride (AMARYL) 4 MG tablet Take 4 mg by mouth daily with breakfast.   05/14/2016 at Unknown time  . lisinopril-hydrochlorothiazide (PRINZIDE,ZESTORETIC) 20-25 MG tablet Take 1 tablet by mouth daily.   05/14/2016 at Unknown time  . metFORMIN (GLUCOPHAGE) 500 MG tablet Take 500 mg by mouth 2 (two) times daily with a meal.   05/14/2016 at Unknown time  . Multiple Vitamin (MULTIVITAMIN WITH MINERALS) TABS tablet Take 2 tablets by mouth daily.   05/14/2016 at  Unknown time  . Omega-3 Fatty Acids (FISH OIL) 1000 MG CAPS Take 2 capsules by mouth daily.   05/14/2016 at Unknown time   Family History  Problem Relation Age of Onset  . Heart failure Mother 27  . Cirrhosis Father   . Heart attack Son 45  . Atrial fibrillation Neg Hx     Social History   Social History  . Marital status: Widowed    Spouse name: N/A  . Number of children: 2  . Years of education: N/A   Occupational History  . Not on file.   Social History Main Topics  . Smoking status: Never Smoker  . Smokeless tobacco: Never Used  . Alcohol use No  . Drug use: No  . Sexual activity: Not on file   Other Topics Concern  . Not on file   Social History Narrative  . No narrative on file     ROS:  Snores  As stated in the HPI and negative for all other systems.  Physical Exam: Blood pressure (!) 118/58, pulse (!) 57, temperature 98 F (36.7 C), temperature source Oral, resp. rate 18, height 5\' 6"  (1.676 m), weight 265 lb 11.2 oz (120.5 kg), SpO2 98 %.  GENERAL:  Well appearing HEENT:  Pupils equal round and reactive, fundi not visualized, oral mucosa unremarkable NECK:  No jugular venous  distention, waveform within normal limits, carotid upstroke brisk and symmetric, no bruits, no thyromegaly LYMPHATICS:  No cervical, inguinal adenopathy LUNGS:  Clear to auscultation bilaterally BACK:  No CVA tenderness CHEST:  Unremarkable HEART:  PMI not displaced or sustained,S1 and S2 within normal limits, no S3,  no clicks, no rubs, no murmurs, irregular ABD:  Flat, positive bowel sounds normal in frequency in pitch, no bruits, no rebound, no guarding, no midline pulsatile mass, no hepatomegaly, no splenomegaly. obese EXT:  2 plus pulses throughout, no edema, no cyanosis no clubbing SKIN:  No rashes no nodules NEURO:  Cranial nerves II through XII grossly intact, motor grossly intact throughout PSYCH:  Cognitively intact, oriented to person place and time  Labs: Lab Results    Component Value Date   BUN 24 (H) 05/16/2016   Lab Results  Component Value Date   CREATININE 0.96 05/16/2016   Lab Results  Component Value Date   NA 139 05/16/2016   K 3.9 05/16/2016   CL 103 05/16/2016   CO2 27 05/16/2016   No results found for: TROPONINI Lab Results  Component Value Date   WBC 12.0 (H) 05/16/2016   HGB 13.3 05/16/2016   HCT 41.1 05/16/2016   MCV 89.2 05/16/2016   PLT 345 05/16/2016   No results found for: CHOL, HDL, LDLCALC, LDLDIRECT, TRIG, CHOLHDL Lab Results  Component Value Date   ALT 18 05/16/2016   AST 20 05/16/2016   ALKPHOS 53 05/16/2016   BILITOT 0.5 05/16/2016    Radiology:   CXR: IMPRESSION: 1. No active cardiopulmonary disease. 2. Chronic elevation of the right hemidiaphragm. 3. Aortic atherosclerosis.  EKG:   Atrial flutter with 2:1 conduction, LAD. No acute ST T wave changes.     ASSESSMENT AND PLAN:   ATRIAL FLUTTER:  Ms. AUGUSTINA BRADDOCK has a CHA2DS2 - VASc score of 5.  She has no contraindication to anticoagulation.  Her rate is not controlled at this point.  I would suggest TEE/DCCV.  I will pend this for tomorrow.  Discussed the risk and benefits with the patient.  Please be aware that she does snore.    HTN:  BP controlled   Signed: Rollene Rotunda 05/16/2016, 12:38 PM

## 2016-05-17 ENCOUNTER — Inpatient Hospital Stay (HOSPITAL_COMMUNITY): Payer: Medicare Other | Admitting: Certified Registered Nurse Anesthetist

## 2016-05-17 ENCOUNTER — Encounter (HOSPITAL_COMMUNITY): Payer: Self-pay | Admitting: *Deleted

## 2016-05-17 ENCOUNTER — Inpatient Hospital Stay (HOSPITAL_COMMUNITY): Payer: Medicare Other

## 2016-05-17 ENCOUNTER — Encounter (HOSPITAL_COMMUNITY)
Admission: EM | Disposition: A | Discharge status: Home / Self Care | Payer: Self-pay | Source: Home / Self Care | Attending: Internal Medicine

## 2016-05-17 DIAGNOSIS — I4891 Unspecified atrial fibrillation: Secondary | ICD-10-CM

## 2016-05-17 DIAGNOSIS — I5020 Unspecified systolic (congestive) heart failure: Secondary | ICD-10-CM

## 2016-05-17 DIAGNOSIS — I5043 Acute on chronic combined systolic (congestive) and diastolic (congestive) heart failure: Secondary | ICD-10-CM

## 2016-05-17 DIAGNOSIS — I4892 Unspecified atrial flutter: Principal | ICD-10-CM

## 2016-05-17 DIAGNOSIS — I340 Nonrheumatic mitral (valve) insufficiency: Secondary | ICD-10-CM

## 2016-05-17 HISTORY — PX: CARDIOVERSION: SHX1299

## 2016-05-17 HISTORY — PX: TEE WITHOUT CARDIOVERSION: SHX5443

## 2016-05-17 LAB — COMPREHENSIVE METABOLIC PANEL
ALT: 20 U/L (ref 14–54)
AST: 20 U/L (ref 15–41)
Albumin: 3.1 g/dL — ABNORMAL LOW (ref 3.5–5.0)
Alkaline Phosphatase: 54 U/L (ref 38–126)
Anion gap: 9 (ref 5–15)
BUN: 21 mg/dL — ABNORMAL HIGH (ref 6–20)
CO2: 27 mmol/L (ref 22–32)
Calcium: 8.7 mg/dL — ABNORMAL LOW (ref 8.9–10.3)
Chloride: 103 mmol/L (ref 101–111)
Creatinine, Ser: 0.94 mg/dL (ref 0.44–1.00)
GFR calc Af Amer: >60 mL/min (ref >60)
GFR calc non Af Amer: 58 mL/min — ABNORMAL LOW (ref >60)
Glucose, Bld: 125 mg/dL — ABNORMAL HIGH (ref 65–99)
Potassium: 3.9 mmol/L (ref 3.5–5.1)
Sodium: 139 mmol/L (ref 135–145)
Total Bilirubin: 0.5 mg/dL (ref 0.3–1.2)
Total Protein: 6.2 g/dL — ABNORMAL LOW (ref 6.5–8.1)

## 2016-05-17 LAB — CBC WITH DIFFERENTIAL/PLATELET
Basophils Absolute: 0 10*3/uL (ref 0.0–0.1)
Basophils Relative: 0 %
Eosinophils Absolute: 0.2 10*3/uL (ref 0.0–0.7)
Eosinophils Relative: 2 %
HCT: 40.1 % (ref 36.0–46.0)
Hemoglobin: 12.8 g/dL (ref 12.0–15.0)
Lymphocytes Relative: 30 %
Lymphs Abs: 3.5 10*3/uL (ref 0.7–4.0)
MCH: 28.5 pg (ref 26.0–34.0)
MCHC: 31.9 g/dL (ref 30.0–36.0)
MCV: 89.3 fL (ref 78.0–100.0)
Monocytes Absolute: 1 10*3/uL (ref 0.1–1.0)
Monocytes Relative: 8 %
Neutro Abs: 7.1 10*3/uL (ref 1.7–7.7)
Neutrophils Relative %: 60 %
Platelets: 338 10*3/uL (ref 150–400)
RBC: 4.49 MIL/uL (ref 3.87–5.11)
RDW: 15.4 % (ref 11.5–15.5)
WBC: 11.7 10*3/uL — ABNORMAL HIGH (ref 4.0–10.5)

## 2016-05-17 LAB — ECHOCARDIOGRAM COMPLETE
Height: 66 in
Weight: 4259.2 oz

## 2016-05-17 LAB — GLUCOSE, CAPILLARY
Glucose-Capillary: 122 mg/dL — ABNORMAL HIGH (ref 65–99)
Glucose-Capillary: 124 mg/dL — ABNORMAL HIGH (ref 65–99)
Glucose-Capillary: 123 mg/dL — ABNORMAL HIGH (ref 65–99)
Glucose-Capillary: 154 mg/dL — ABNORMAL HIGH (ref 65–99)

## 2016-05-17 LAB — BRAIN NATRIURETIC PEPTIDE: B Natriuretic Peptide: 155.3 pg/mL — ABNORMAL HIGH (ref 0.0–100.0)

## 2016-05-17 LAB — MAGNESIUM: Magnesium: 1.7 mg/dL (ref 1.7–2.4)

## 2016-05-17 LAB — PHOSPHORUS: Phosphorus: 3.9 mg/dL (ref 2.5–4.6)

## 2016-05-17 SURGERY — ECHOCARDIOGRAM, TRANSESOPHAGEAL
Anesthesia: Monitor Anesthesia Care

## 2016-05-17 MED ORDER — PROPOFOL 500 MG/50ML IV EMUL
INTRAVENOUS | Status: DC | PRN
  Administered 2016-05-17: 100 ug/kg/min via INTRAVENOUS

## 2016-05-17 MED ORDER — PROPOFOL 10 MG/ML IV BOLUS
INTRAVENOUS | Status: DC | PRN
  Administered 2016-05-17: 20 mg via INTRAVENOUS

## 2016-05-17 MED ORDER — HYDROCHLOROTHIAZIDE 25 MG PO TABS
25.0000 mg | ORAL_TABLET | Freq: Every day | ORAL | Status: DC
  Administered 2016-05-17: 25 mg via ORAL
  Filled 2016-05-17 (×2): qty 1

## 2016-05-17 MED ORDER — FENTANYL CITRATE (PF) 100 MCG/2ML IJ SOLN: 25.0000 ug | INTRAMUSCULAR | Status: DC | PRN

## 2016-05-17 MED ORDER — LISINOPRIL 20 MG PO TABS
20.0000 mg | ORAL_TABLET | Freq: Every day | ORAL | Status: DC
  Administered 2016-05-17 – 2016-05-18 (×2): 20 mg via ORAL
  Filled 2016-05-17 (×2): qty 1

## 2016-05-17 MED ORDER — GLYCOPYRROLATE 0.2 MG/ML IV SOSY
PREFILLED_SYRINGE | INTRAVENOUS | Status: DC | PRN
  Administered 2016-05-17: .2 mg via INTRAVENOUS

## 2016-05-17 MED ORDER — LIDOCAINE 2% (20 MG/ML) 5 ML SYRINGE
INTRAMUSCULAR | Status: DC | PRN
  Administered 2016-05-17: 60 mg via INTRAVENOUS

## 2016-05-17 MED ORDER — LISINOPRIL-HYDROCHLOROTHIAZIDE 20-25 MG PO TABS: 1.0000 | ORAL_TABLET | Freq: Every day | ORAL | Status: DC

## 2016-05-17 NOTE — H&P (View-Only) (Signed)
CARDIOLOGY CONSULT NOTE  Patient ID: Alexandra Green MRN: [id number] DOB/AGE: 1940/09/17 76 y.o.  Admit date: 05/14/2016 Primary Physician Alexandra Pitter, MD Primary Cardiologist Alexandra Green) Chief Complaint  Palpitations Requesting  Dr. Marland Green  HPI:   The patient has no past cardiac history.  She felt some weakness about a week ago and palpitations.  she took her pulse eventually and noted that it was irregular.  She went to see Dr. Parke Green on Friday and was noted to be in atrial flutter.  She came to the ED and was treated with IV Cardizem.  She is now on Eliquis and PO Cardizem.  Her rate is 2:1 and 3:1 predominantly while lying in bed.  The patient denies any Alexandra symptoms such as chest discomfort, neck or arm discomfort. There has been no Alexandra, PND or orthopnea. There have been no reported, presyncope or syncope.  She walks with a cane secondary to joint and back pain.     Past Medical History:  Diagnosis Date  . Arthritis   . Diabetes mellitus without complication (HCC)   . Hypertension     Past Surgical History:  Procedure Laterality Date  . ABDOMINAL HYSTERECTOMY    . CATARACT EXTRACTION    . CHOLECYSTECTOMY    . TOTAL KNEE ARTHROPLASTY Left     No Known Allergies Prescriptions Prior to Admission  Medication Sig Dispense Refill Last Dose  . amLODipine (NORVASC) 10 MG tablet Take 10 mg by mouth daily.   05/14/2016 at Unknown time  . aspirin EC 81 MG tablet Take 81 mg by mouth 2 (two) times daily.   05/13/2016 at Unknown time  . glimepiride (AMARYL) 4 MG tablet Take 4 mg by mouth daily with breakfast.   05/14/2016 at Unknown time  . lisinopril-hydrochlorothiazide (PRINZIDE,ZESTORETIC) 20-25 MG tablet Take 1 tablet by mouth daily.   05/14/2016 at Unknown time  . metFORMIN (GLUCOPHAGE) 500 MG tablet Take 500 mg by mouth 2 (two) times daily with a meal.   05/14/2016 at Unknown time  . Multiple Vitamin (MULTIVITAMIN WITH MINERALS) TABS tablet Take 2 tablets by mouth daily.   05/14/2016 at  Unknown time  . Omega-3 Fatty Acids (FISH OIL) 1000 MG CAPS Take 2 capsules by mouth daily.   05/14/2016 at Unknown time   Family History  Problem Relation Age of Onset  . Heart failure Mother 27  . Cirrhosis Father   . Heart attack Son 45  . Atrial fibrillation Neg Hx     Social History   Social History  . Marital status: Widowed    Spouse name: N/A  . Number of children: 2  . Years of education: N/A   Occupational History  . Not on file.   Social History Main Topics  . Smoking status: Never Smoker  . Smokeless tobacco: Never Used  . Alcohol use No  . Drug use: No  . Sexual activity: Not on file   Other Topics Concern  . Not on file   Social History Narrative  . No narrative on file     ROS:  Snores  As stated in the HPI and negative for all other systems.  Physical Exam: Blood pressure (!) 118/58, pulse (!) 57, temperature 98 F (36.7 C), temperature source Oral, resp. rate 18, height 5\' 6"  (1.676 m), weight 265 lb 11.2 oz (120.5 kg), SpO2 98 %.  GENERAL:  Well appearing HEENT:  Pupils equal round and reactive, fundi not visualized, oral mucosa unremarkable NECK:  No jugular venous  distention, waveform within normal limits, carotid upstroke brisk and symmetric, no bruits, no thyromegaly LYMPHATICS:  No cervical, inguinal adenopathy LUNGS:  Clear to auscultation bilaterally BACK:  No CVA tenderness CHEST:  Unremarkable HEART:  PMI not displaced or sustained,S1 and S2 within normal limits, no S3,  no clicks, no rubs, no murmurs, irregular ABD:  Flat, positive bowel sounds normal in frequency in pitch, no bruits, no rebound, no guarding, no midline pulsatile mass, no hepatomegaly, no splenomegaly. obese EXT:  2 plus pulses throughout, no edema, no cyanosis no clubbing SKIN:  No rashes no nodules NEURO:  Cranial nerves II through XII grossly intact, motor grossly intact throughout PSYCH:  Cognitively intact, oriented to person place and time  Labs: Lab Results    Component Value Date   BUN 24 (H) 05/16/2016   Lab Results  Component Value Date   CREATININE 0.96 05/16/2016   Lab Results  Component Value Date   NA 139 05/16/2016   K 3.9 05/16/2016   CL 103 05/16/2016   CO2 27 05/16/2016   No results found for: TROPONINI Lab Results  Component Value Date   WBC 12.0 (H) 05/16/2016   HGB 13.3 05/16/2016   HCT 41.1 05/16/2016   MCV 89.2 05/16/2016   PLT 345 05/16/2016   No results found for: CHOL, HDL, LDLCALC, LDLDIRECT, TRIG, CHOLHDL Lab Results  Component Value Date   ALT 18 05/16/2016   AST 20 05/16/2016   ALKPHOS 53 05/16/2016   BILITOT 0.5 05/16/2016    Radiology:   CXR: IMPRESSION: 1. No active cardiopulmonary disease. 2. Chronic elevation of the right hemidiaphragm. 3. Aortic atherosclerosis.  EKG:   Atrial flutter with 2:1 conduction, LAD. No acute ST T wave changes.     ASSESSMENT AND PLAN:   ATRIAL FLUTTER:  [name] has a CHA2DS2 - VASc score of 5.  She has no contraindication to anticoagulation.  Her rate is not controlled at this point.  I would suggest TEE/DCCV.  I will pend this for tomorrow.  Discussed the risk and benefits with the patient.  Please be aware that she does snore.    HTN:  BP controlled   Signed: Rollene Green 05/16/2016, 12:38 PM

## 2016-05-17 NOTE — Progress Notes (Signed)
PROGRESS NOTE    PRIMA STOVES  LHT:[id number] DOB: 09/16/1940 DOA: 05/14/2016 PCP: Geraldo Pitter, MD   Brief Narrative:  Alexandra Green is a 76 y.o. female with medical history significant of HTN, DM, arthritis who presents for SOB for about 6 days.  She first noticed this symptom when she was coming down the steps from church on Sunday and she was SOB and had to stop to rest.  This is not normal for her. Intermittently throughout the week, she would have SOB and also have episodes of palpitations and feeling like her heart was flip-flopping.  She has had no preceding illness except a mild cough.  She has not changed her diet and drinks only one cup of coffee day and decaffeinated tea at night.  This is not new for her.  No new medications, supplements or energy drinks.  She denies lightheadedness, chest pain, dizziness.  She continues to sleep with the same number of pillows and has no PND.  She does have some mild swelling in her legs and some chronic mild constipation.  She was found in clinic to be in Afib with RVR and sent to the Ed. Pateint was noted to be in A Flutter with RVR in the ED and placed on Cardizem gtt and admitted to the Hospital. When asked patient states she can feel her heart rate get irregular. Patient is off Cardizem gtt and now on oral Diltiazem and Cardiology saw and evaluated patient and she underwent successful TTE and Cardiversion with synchronized biphasic defibrillation with 120 joules.   Assessment & Plan:   Principal Problem:   New onset atrial flutter (HCC) Active Problems:   Essential hypertension   Diabetes mellitus without complication (HCC)   Leukocytosis  New Onset Atrial Flutter with 2:1 and 3:1 Conduction s/p successful Cardioversion to NSR done by Dr. Anne Fu -Had Rapid Ventricular Response on admission -EKG with flutter and this appears consistent with telemetry findings -Rate controlled with cardizem ggt, however, with activity she did become  tachycardic again -Continue Telemetry; Cardizem gtt Discontinued -Patient initially placed on 30 mg of Cardizem q6h however switched to Long Acting Cardizem 240 mg po Daily; However changed to 120 mg po Daily -C/w Cardizem 120 mg po Daily -ECHOcardiogram showed LVEF is mildly decreased at 40-45% with diffuse hypokinesis.   Mild pulmonary hypertension, RVSP 40 mmHg -Given high CHADS-VASC (5) - C/w Eliquis 5 mg po BID; Case Management involved to check price per month.  -No chest pain, so did not trend troponin.  Would do so if she developed chest pain -Continue to Monitor and defer to Cardiology to start Beta Blocker  -Cardiology to set patient up with TEE/DCCV and under went successful Cardioversion 05/17/16  Systolic CHF with EF of 45-50% on TEE and 40-45% on TTE with Diffuse Hypokinesis  -Currently not Decompensated of in Exacerbation; ? Rate Related  -Cardiology Following and will defer to them for when to do ischemic workup; Will need Repeat ECHO -Strict I's and O's, Daily Weights, and Fluid Restriction at 1500 mL/day -Home Meds of Lisinopril-HCTZ held on Admission; Will restart today -Check BNP -Will need BB but will defer to Cardiology -Restart ASA 81 mg daily -Discuss with Cardiology and will definitely need Cardiology Follow up at D/C  Mild Leukocytosis -No evidence of Infection and Unknown Baseline -WBC went from 11.7 -> 11.3 -> 12.0 -> 11.7 -Monitor for S/Sx of Infection; States she was coughing yesterday but not productive -Repeat CBC in AM  Hypertension -Home Amlodipine  10 mg and Lisinopril-HCTZ 20-25 mg held as patient was started on Diltazem gtt -Will likely restart Lisinopril-HCTZ; Continue to hold Amlodipine for now -Continue Diltiazem120 mg po Daily  Non-Insulin Dependent Diabetes Mellitus -Home Meds of Glimperide and Metformin -Hb A1c was 6.3 -C/w Metformin 500 mg po BID and hold Glimpieride -Started on Moderate Novolog SSI AC and HS -CBG's have been running from  101-124  Osteoarthritis of the Knees -C/w Acetaminophen 650 mg po q4hprn for Mild Pain  Morbid Obesity -BMI 42.7 -Weight Loss Counseling given  DVT prophylaxis: Anticoagulated with Apxiaban Code Status: FULL CODE Family Communication: Discussed with Daughter at bedside Disposition Plan: Possibly D/C Home in AM  Consultants:   Cardiology [facility]    Procedures:  TransThoracic ECHOCardiogram - Study Conclusions  - Left ventricle: The cavity size was normal. There was moderate   concentric hypertrophy. Systolic function was mildly to   moderately reduced. The estimated ejection fraction was in the   range of 40% to 45%. Diffuse hypokinesis. - Aortic valve: Trileaflet; normal thickness leaflets. There was no   regurgitation. - Mitral valve: Calcified annulus. Mildly thickened leaflets .   There was mild regurgitation. - Left atrium: The atrium was mildly dilated. - Right ventricle: Systolic function was normal. - Right atrium: The atrium was mildly dilated. - Tricuspid valve: There was moderate regurgitation. - Pulmonic valve: There was mild regurgitation. - Pulmonary arteries: Systolic pressure was mildly increased. PA   peak pressure: 40 mm Hg (S). - Inferior vena cava: The vessel was dilated. The respirophasic   diameter changes were blunted (< 50%), consistent with elevated   central venous pressure. - Pericardium, extracardiac: There was no pericardial effusion.  Impressions:  - LVEF is mildly decreased at 40-45% with diffuse hypokinesis.   Mild pulmonary hypertension, RVSP 40 mmHg.  TransEsophageal ECHOCARDIOGRAM Study Conclusions  - Left ventricle: The cavity size was normal. Wall thickness was   normal. Systolic function was mildly reduced. The estimated   ejection fraction was in the range of 45% to 50%. - Mitral valve: No evidence of vegetation. There was mild to   moderate regurgitation. - Left atrium: No evidence of thrombus in the  appendage. - Right ventricle: RV systolic pressure (S, est): 44 mm Hg. - Tricuspid valve: Mildly thickened leaflets. There was   moderate-severe regurgitation. - Pulmonary arteries: Systolic pressure was moderately increased.   PA peak pressure: 44 mm Hg (S).   Antimicrobials:  Anti-infectives    None     Subjective: Seen and examined after her TEE/Cardioversion and was doing well. No nausea or vomiting. Stated her chest burned a little from the shock. No Cp. No other complaints or concerns at this time.   Objective: Vitals:   05/17/16 0505 05/17/16 0929 05/17/16 1037 05/17/16 1045  BP: (!) 110/55 138/81 132/77 128/78  Pulse: 62 (!) 134 86 81  Resp: 18 19 20 18   Temp: 98.4 F (36.9 C) 98.3 F (36.8 C) 97.6 F (36.4 C)   TempSrc: Oral Oral Oral   SpO2: 97% 96% 96% 95%  Weight: 120.7 kg (266 lb 3.2 oz)     Height:        Intake/Output Summary (Last 24 hours) at 05/17/16 1139 Last data filed at 05/17/16 0512  Gross per 24 hour  Intake              480 ml  Output             1000 ml  Net             -  520 ml   Filed Weights   05/15/16 1046 05/16/16 0513 05/17/16 0505  Weight: 119.8 kg (264 lb 3.2 oz) 120.5 kg (265 lb 11.2 oz) 120.7 kg (266 lb 3.2 oz)   Examination: Physical Exam:  Constitutional: WN/WD obese , NAD and appears calm and comfortable Eyes: Lids and conjunctivae normal, sclerae anicteric  ENMT: External Ears, Nose appear normal. Grossly normal hearing.  Neck: Appears normal, supple, no cervical masses, normal ROM, no appreciable thyromegaly, no JVD Respiratory: Clear to auscultation bilaterally, no wheezing, rales, rhonchi or crackles. Normal respiratory effort and patient is not tachypenic. No accessory muscle use.  Cardiovascular: RRR, no murmurs / rubs / gallops. S1 and S2 auscultated. No extremity edema.  Abdomen: Soft, non-tender, non-distended. No masses palpated. No appreciable hepatosplenomegaly. Bowel sounds positive.  GU:  Deferred. Musculoskeletal: No clubbing / cyanosis of digits/nails. No joint deformity upper and lower extremities. No Contractures  Skin: No rashes, lesions, ulcers on limited skin evaluation. No induration; Warm and dry.  Neurologic: CN 2-12 grossly intact with no focal deficits. Sensation intact in all 4 Extremities. Romberg sign cerebellar reflexes not assessed.  Psychiatric: Normal judgment and insight. Alert and oriented x 3. Normal mood and appropriate affect.   Data Reviewed: I have personally reviewed following labs and imaging studies  CBC:  Recent Labs Lab 05/14/16 1800 05/15/16 0320 05/16/16 0411 05/17/16 0449  WBC 11.7* 11.3* 12.0* 11.7*  NEUTROABS  --  7.7 7.4 7.1  HGB 15.2* 13.6 13.3 12.8  HCT 46.4* 41.9 41.1 40.1  MCV 90.1 89.3 89.2 89.3  PLT 346 328 345 338   Basic Metabolic Panel:  Recent Labs Lab 05/14/16 1800 05/15/16 0320 05/16/16 0411 05/17/16 0449  NA 141 141 139 139  K 4.7 3.7 3.9 3.9  CL 101 104 103 103  CO2 27 25 27 27   GLUCOSE 111* 110* 126* 125*  BUN 18 15 24* 21*  CREATININE 0.83 0.80 0.96 0.94  CALCIUM 9.7 9.0 8.9 8.7*  MG  --   --  1.8 1.7  PHOS  --   --  4.2 3.9   GFR: Estimated Creatinine Clearance: 68.5 mL/min (by C-G formula based on SCr of 0.94 mg/dL). Liver Function Tests:  Recent Labs Lab 05/16/16 0411 05/17/16 0449  AST 20 20  ALT 18 20  ALKPHOS 53 54  BILITOT 0.5 0.5  PROT 6.5 6.2*  ALBUMIN 3.2* 3.1*   No results for input(s): LIPASE, AMYLASE in the last 168 hours. No results for input(s): AMMONIA in the last 168 hours. Coagulation Profile: No results for input(s): INR, PROTIME in the last 168 hours. Cardiac Enzymes: No results for input(s): CKTOTAL, CKMB, CKMBINDEX, TROPONINI in the last 168 hours. BNP (last 3 results) No results for input(s): PROBNP in the last 8760 hours. HbA1C:  Recent Labs  05/15/16 0320  HGBA1C 6.3*   CBG:  Recent Labs Lab 05/16/16 0733 05/16/16 1135 05/16/16 1617 05/16/16 2144  05/17/16 0758  GLUCAP 143* 96 143* 101* 122*   Lipid Profile: No results for input(s): CHOL, HDL, LDLCALC, TRIG, CHOLHDL, LDLDIRECT in the last 72 hours. Thyroid Function Tests:  Recent Labs  05/14/16 2053  TSH 1.711   Anemia Panel: No results for input(s): VITAMINB12, FOLATE, FERRITIN, TIBC, IRON, RETICCTPCT in the last 72 hours. Sepsis Labs: No results for input(s): PROCALCITON, LATICACIDVEN in the last 168 hours.  No results found for this or any previous visit (from the past 240 hour(s)).   Radiology Studies: No results found. Scheduled Meds: . apixaban  5 mg Oral BID  . diltiazem  120 mg Oral Daily  . insulin aspart  0-15 Units Subcutaneous TID WC  . insulin aspart  0-5 Units Subcutaneous QHS  . metFORMIN  500 mg Oral BID WC  . multivitamin with minerals  2 tablet Oral Daily  . omega-3 acid ethyl esters  2 g Oral Daily   Continuous Infusions:   LOS: 3 days   Merlene Laughter, DO [facility] Pager [phone]  If 7PM-7AM, please contact night-coverage [email or web address] Password [email or web address] 05/17/2016, 11:39 AM

## 2016-05-17 NOTE — Transfer of Care (Signed)
Immediate Anesthesia Transfer of Care Note  Patient: Alexandra Green  Procedure(s) Performed: Procedure(s): TRANSESOPHAGEAL ECHOCARDIOGRAM (TEE) (N/A) CARDIOVERSION (N/A)  Patient Location: Endoscopy Unit  Anesthesia Type:MAC  Level of Consciousness: awake and alert   Airway & Oxygen Therapy: Patient Spontanous Breathing and Patient connected to nasal cannula oxygen  Post-op Assessment: Report given to RN and Post -op Vital signs reviewed and stable  Post vital signs: Reviewed and stable  Last Vitals:  Vitals:   05/17/16 0929 05/17/16 1037  BP: 138/81 132/77  Pulse: (!) 134 86  Resp: 19 20  Temp: 36.8 C 36.4 C    Last Pain:  Vitals:   05/17/16 1037  TempSrc: Oral  PainSc:          Complications: No apparent anesthesia complications

## 2016-05-17 NOTE — Progress Notes (Signed)
Pt had a uneventful Night with stable low pressure, Plan is for an TEE Cardioversion. EKG this am prior to TEE.

## 2016-05-17 NOTE — CV Procedure (Signed)
    Electrical Cardioversion Procedure Note Alexandra Green [id number] 1940/05/22  Procedure: Electrical Cardioversion Indications:  Atrial Flutter  Time Out: Verified patient identification, verified procedure,medications/allergies/relevent history reviewed, required imaging and test results available.  Performed  Procedure Details  The patient was NPO after midnight. Anesthesia was administered at the beside  by anesthesia propofol.  Cardioversion was performed with synchronized biphasic defibrillation via AP pads with 120 joules.  1 attempt(s) were performed.  The patient converted to normal sinus rhythm. The patient tolerated the procedure well   IMPRESSION:  Successful cardioversion of atrial flutter    Donato Schultz 05/17/2016, 10:32 AM

## 2016-05-17 NOTE — Interval H&P Note (Signed)
History and Physical Interval Note:  05/17/2016 9:36 AM  Alexandra Green  has presented today for surgery, with the diagnosis of a fib  The various methods of treatment have been discussed with the patient and family. After consideration of risks, benefits and other options for treatment, the patient has consented to  Procedure(s): TRANSESOPHAGEAL ECHOCARDIOGRAM (TEE) (N/A) CARDIOVERSION (N/A) as a surgical intervention .  The patient's history has been reviewed, patient examined, no change in status, stable for surgery.  I have reviewed the patient's chart and labs.  Questions were answered to the patient's satisfaction.     [employer]

## 2016-05-17 NOTE — Progress Notes (Signed)
Progress Note  Patient Name: Alexandra Green Date of Encounter: 05/17/2016  Primary Cardiologist: None  Subjective   She had continued palpitations last night without chest pain or dyspnea. Underwent synchronized cardioversion this morning without incident and now feels at baseline.  Inpatient Medications    Scheduled Meds: . apixaban  5 mg Oral BID  . diltiazem  120 mg Oral Daily  . insulin aspart  0-15 Units Subcutaneous TID WC  . insulin aspart  0-5 Units Subcutaneous QHS  . metFORMIN  500 mg Oral BID WC  . multivitamin with minerals  2 tablet Oral Daily  . omega-3 acid ethyl esters  2 g Oral Daily   Continuous Infusions:  PRN Meds: acetaminophen, ondansetron (ZOFRAN) IV   Vital Signs    Vitals:   05/17/16 0505 05/17/16 0929 05/17/16 1037 05/17/16 1045  BP: (!) 110/55 138/81 132/77 128/78  Pulse: 62 (!) 134 86 81  Resp: 18 19 20 18   Temp: 98.4 F (36.9 C) 98.3 F (36.8 C) 97.6 F (36.4 C)   TempSrc: Oral Oral Oral   SpO2: 97% 96% 96% 95%  Weight: 266 lb 3.2 oz (120.7 kg)     Height:        Intake/Output Summary (Last 24 hours) at 05/17/16 1149 Last data filed at 05/17/16 7591  Gross per 24 hour  Intake              480 ml  Output             1000 ml  Net             -520 ml   Filed Weights   05/15/16 1046 05/16/16 0513 05/17/16 0505  Weight: 264 lb 3.2 oz (119.8 kg) 265 lb 11.2 oz (120.5 kg) 266 lb 3.2 oz (120.7 kg)    Telemetry    Atrial flutter on telemetry overnight now in NSR with rate in 90s - Personally Reviewed  ECG    2/5 @ 11:04 - In normal sinus rhythm with isolated PVC, left axis deviation  Physical Exam   General: Overweight female appearing in no acute distress Head: Normocephalic, atraumatic Neck: Supple without bruits, JVD Lungs:  Resp regular and unlabored, CTA Heart: RRR, S1, S2, no S3, S4, or murmur; no rub. Abdomen: Obese, nontender, nondistended Extremities: No clubbing, cyanosis, no pitting edema. Distal pedal pulses  are 2+ bilaterally. Neuro: Alert and oriented X 3. Moves all extremities spontaneously. Psych: Normal affect.  Labs    Chemistry  Recent Labs Lab 05/15/16 0320 05/16/16 0411 05/17/16 0449  NA 141 139 139  K 3.7 3.9 3.9  CL 104 103 103  CO2 25 27 27   GLUCOSE 110* 126* 125*  BUN 15 24* 21*  CREATININE 0.80 0.96 0.94  CALCIUM 9.0 8.9 8.7*  PROT  --  6.5 6.2*  ALBUMIN  --  3.2* 3.1*  AST  --  20 20  ALT  --  18 20  ALKPHOS  --  53 54  BILITOT  --  0.5 0.5  GFRNONAA >60 56* 58*  GFRAA >60 >60 >60  ANIONGAP 12 9 9      Hematology  Recent Labs Lab 05/15/16 0320 05/16/16 0411 05/17/16 0449  WBC 11.3* 12.0* 11.7*  RBC 4.69 4.61 4.49  HGB 13.6 13.3 12.8  HCT 41.9 41.1 40.1  MCV 89.3 89.2 89.3  MCH 29.0 28.9 28.5  MCHC 32.5 32.4 31.9  RDW 15.7* 15.4 15.4  PLT 328 345 338  Radiology    No results found.  Cardiac Studies   LV EF: 45% -   50%  ------------------------------------------------------------------- Indications:      Atrial fibrillation - 427.31.  ------------------------------------------------------------------- History:   Risk factors:  Hypertension. Diabetes mellitus.  ------------------------------------------------------------------- Study Conclusions  - Left ventricle: The cavity size was normal. Wall thickness was   normal. Systolic function was mildly reduced. The estimated   ejection fraction was in the range of 45% to 50%. - Mitral valve: No evidence of vegetation. There was mild to   moderate regurgitation. - Left atrium: No evidence of thrombus in the appendage. - Right ventricle: RV systolic pressure (S, est): 44 mm Hg. - Tricuspid valve: Mildly thickened leaflets. There was   moderate-severe regurgitation. - Pulmonary arteries: Systolic pressure was moderately increased.   PA peak pressure: 44 mm Hg (S).   Patient Profile     76 y.o. female with HTN, DM, arthritis and no cardiac history who presented for  palpitations of about a week causing dyspnea with light exertion preceded by cough symptoms for about a week prior. Cardiology was consulted for atrial flutter with RVR.  Assessment & Plan    1. Atrial flutter: New onset without any known heart history. No thrombus on TEE. Study EF is decreased at 45-50% with elevated PA pressures but this is in the setting of atrial flutter. Aortic DCCV performed today successfully now in normal sinus rhythm and asymptomatic. If this is recurrent can consider ablation. Echo will eventually need to be repeated in the future once in controlled rhythm in case there is underlying ischemic disease needing workup. Aortic atherosclerosis noted on radiography. -Continue anticoagulation at this time unknown if arrhythmia is paroxysmal or fully resolved -Continue diltiazem ER 120mg  -Observation today  2. Hypertension: -Normotensive during admission without medications.  3. Diabetes: CBGs are well controlled. -Management per primary team   Fuller Plan, MD PGY-II Internal Medicine Resident Pager# [phone] 05/17/2016, 11:49 AM  Attending Note:   The patient was seen and examined.  Agree with assessment and plan as noted above.  Changes made to the above note as needed.  Patient seen and independently examined with Mena Pauls .   We discussed all aspects of the encounter. I agree with the assessment and plan as stated above.  1. Atrial flutter :   S/p Cardioversion .   Continue current meds.  Continue Eliquis 5 BID, dilt DC 120 mg a day - titrate as needed.   2.  Mild systolic CHF :   This may be rate related   I would recheck an echo in several weeks to see if her EF has normalized. If it does not , would start further work up for CHF She will follow up with Dr. Antoine Poche.   3. Hx of snoring -   She may need further eval for OSA.   This would be a contributing factor for atrial flutter.    I think that she could go home tomorrow if she  remains stable Follow up with Dr. Antoine Poche / APP   I have spent a total of 30 minutes with patient reviewing hospital  notes , telemetry, EKGs, labs and examining patient as well as establishing an assessment and plan that was discussed with the patient. > 50% of time was spent in direct patient care.    Vesta Mixer, Montez Hageman., MD, [facility] 05/17/2016, 1:26 PM [address]  [address] Office [phone] Pager [phone]

## 2016-05-17 NOTE — Anesthesia Preprocedure Evaluation (Addendum)
Anesthesia Evaluation  Patient identified by MRN, date of birth, ID band Patient awake    Reviewed: Allergy & Precautions, NPO status , Patient's Chart, lab work & pertinent test results  Airway Mallampati: I       Dental   Pulmonary neg pulmonary ROS,    breath sounds clear to auscultation       Cardiovascular hypertension, +CHF   Rhythm:Irregular Rate:Normal     Neuro/Psych    GI/Hepatic negative GI ROS, Neg liver ROS,   Endo/Other  diabetes  Renal/GU      Musculoskeletal   Abdominal   Peds  Hematology   Anesthesia Other Findings   Reproductive/Obstetrics                           Anesthesia Physical Anesthesia Plan  ASA: III  Anesthesia Plan: General   Post-op Pain Management:    Induction: Intravenous  Airway Management Planned: Simple Face Mask  Additional Equipment:   Intra-op Plan:   Post-operative Plan:   Informed Consent: I have reviewed the patients History and Physical, chart, labs and discussed the procedure including the risks, benefits and alternatives for the proposed anesthesia with the patient or authorized representative who has indicated his/her understanding and acceptance.   Dental advisory given  Plan Discussed with: CRNA and Anesthesiologist  Anesthesia Plan Comments:       Anesthesia Quick Evaluation

## 2016-05-18 ENCOUNTER — Encounter (HOSPITAL_COMMUNITY): Payer: Self-pay | Admitting: Cardiology

## 2016-05-18 DIAGNOSIS — I5021 Acute systolic (congestive) heart failure: Secondary | ICD-10-CM

## 2016-05-18 LAB — CBC WITH DIFFERENTIAL/PLATELET
Basophils Absolute: 0 10*3/uL (ref 0.0–0.1)
Basophils Relative: 0 %
Eosinophils Absolute: 0.2 10*3/uL (ref 0.0–0.7)
Eosinophils Relative: 2 %
HCT: 37.8 % (ref 36.0–46.0)
Hemoglobin: 12 g/dL (ref 12.0–15.0)
Lymphocytes Relative: 26 %
Lymphs Abs: 2.8 10*3/uL (ref 0.7–4.0)
MCH: 28.6 pg (ref 26.0–34.0)
MCHC: 31.7 g/dL (ref 30.0–36.0)
MCV: 90 fL (ref 78.0–100.0)
Monocytes Absolute: 0.9 10*3/uL (ref 0.1–1.0)
Monocytes Relative: 8 %
Neutro Abs: 6.9 10*3/uL (ref 1.7–7.7)
Neutrophils Relative %: 64 %
Platelets: 327 10*3/uL (ref 150–400)
RBC: 4.2 MIL/uL (ref 3.87–5.11)
RDW: 15.2 % (ref 11.5–15.5)
WBC: 10.9 10*3/uL — ABNORMAL HIGH (ref 4.0–10.5)

## 2016-05-18 LAB — COMPREHENSIVE METABOLIC PANEL
ALT: 21 U/L (ref 14–54)
AST: 19 U/L (ref 15–41)
Albumin: 3 g/dL — ABNORMAL LOW (ref 3.5–5.0)
Alkaline Phosphatase: 53 U/L (ref 38–126)
Anion gap: 12 (ref 5–15)
BUN: 19 mg/dL (ref 6–20)
CO2: 28 mmol/L (ref 22–32)
Calcium: 8.9 mg/dL (ref 8.9–10.3)
Chloride: 101 mmol/L (ref 101–111)
Creatinine, Ser: 0.81 mg/dL (ref 0.44–1.00)
GFR calc Af Amer: >60 mL/min (ref >60)
GFR calc non Af Amer: >60 mL/min (ref >60)
Glucose, Bld: 104 mg/dL — ABNORMAL HIGH (ref 65–99)
Potassium: 4 mmol/L (ref 3.5–5.1)
Sodium: 141 mmol/L (ref 135–145)
Total Bilirubin: 0.6 mg/dL (ref 0.3–1.2)
Total Protein: 6.1 g/dL — ABNORMAL LOW (ref 6.5–8.1)

## 2016-05-18 LAB — GLUCOSE, CAPILLARY
Glucose-Capillary: 111 mg/dL — ABNORMAL HIGH (ref 65–99)
Glucose-Capillary: 106 mg/dL — ABNORMAL HIGH (ref 65–99)

## 2016-05-18 LAB — PHOSPHORUS: Phosphorus: 5.1 mg/dL — ABNORMAL HIGH (ref 2.5–4.6)

## 2016-05-18 LAB — MAGNESIUM: Magnesium: 1.8 mg/dL (ref 1.7–2.4)

## 2016-05-18 MED ORDER — FUROSEMIDE 10 MG/ML IJ SOLN
20.0000 mg | Freq: Once | INTRAMUSCULAR | Status: AC
  Administered 2016-05-18: 20 mg via INTRAVENOUS
  Filled 2016-05-18: qty 2

## 2016-05-18 MED ORDER — FLUTICASONE PROPIONATE 50 MCG/ACT NA SUSP
1.0000 | Freq: Every day | NASAL | Status: DC
  Administered 2016-05-18: 1 via NASAL
  Filled 2016-05-18: qty 16

## 2016-05-18 MED ORDER — APIXABAN 5 MG PO TABS: 5.0000 mg | ORAL_TABLET | Freq: Two times a day (BID) | ORAL | 0 refills | Status: DC

## 2016-05-18 MED ORDER — CARVEDILOL 6.25 MG PO TABS: 6.2500 mg | ORAL_TABLET | Freq: Two times a day (BID) | ORAL | 0 refills | Status: DC

## 2016-05-18 MED ORDER — CARVEDILOL 6.25 MG PO TABS: 6.2500 mg | ORAL_TABLET | Freq: Two times a day (BID) | ORAL | Status: DC

## 2016-05-18 MED ORDER — DILTIAZEM HCL ER COATED BEADS 120 MG PO CP24: 120.0000 mg | ORAL_CAPSULE | Freq: Every day | ORAL | 0 refills | Status: DC

## 2016-05-18 MED ORDER — FLUTICASONE PROPIONATE 50 MCG/ACT NA SUSP: 1.0000 | Freq: Every day | NASAL | 2 refills | Status: DC

## 2016-05-18 NOTE — Care Management Important Message (Signed)
Important Message  Patient Details  Name: Alexandra Green MRN: [id number] Date of Birth: 13-Nov-1940   Medicare Important Message Given:  Yes    Madden Piazza Stefan Church 05/18/2016, 12:54 PM

## 2016-05-18 NOTE — Progress Notes (Signed)
Progress Note  Patient Name: Alexandra Green Date of Encounter: 05/18/2016  Primary Cardiologist: (New) Hochrein  Subjective   She feels well without any palpitations or dyspnea. Remains in sinus rhythm.  Inpatient Medications    Scheduled Meds: . apixaban  5 mg Oral BID  . carvedilol  6.25 mg Oral BID WC  . fluticasone  1 spray Each Nare Daily  . lisinopril  20 mg Oral Daily   Or  . hydrochlorothiazide  25 mg Oral Daily  . insulin aspart  0-15 Units Subcutaneous TID WC  . insulin aspart  0-5 Units Subcutaneous QHS  . metFORMIN  500 mg Oral BID WC  . multivitamin with minerals  2 tablet Oral Daily  . omega-3 acid ethyl esters  2 g Oral Daily   Continuous Infusions:  PRN Meds: acetaminophen, ondansetron (ZOFRAN) IV   Vital Signs    Vitals:   05/17/16 1206 05/17/16 2149 05/18/16 0457 05/18/16 0911  BP: 122/76 (!) 111/55 (!) 109/52 119/67  Pulse: 84 81 79   Resp: 18 18 18    Temp: 98.4 F (36.9 C) 98 F (36.7 C) 98.4 F (36.9 C)   TempSrc: Oral Oral Oral   SpO2: 98% 98% 98%   Weight:   267 lb (121.1 kg)   Height:        Intake/Output Summary (Last 24 hours) at 05/18/16 1046 Last data filed at 05/18/16 0900  Gross per 24 hour  Intake              920 ml  Output             2875 ml  Net            -1955 ml   Filed Weights   05/16/16 0513 05/17/16 0505 05/18/16 0457  Weight: 265 lb 11.2 oz (120.5 kg) 266 lb 3.2 oz (120.7 kg) 267 lb (121.1 kg)    Telemetry    Sinus rhythm overnight with rate in the 70s - Personally Reviewed  ECG    N/A  Physical Exam   General: Overweight female appearing in no acute distress Head: Normocephalic, atraumatic Neck: Supple without bruits Lungs:  Resp regular and unlabored, CTA Heart: RRR, S1, S2, no S3, S4, or murmur; no rub. Extremities: No clubbing, cyanosis, no pitting edema. Distal pedal pulses are 2+ bilaterally. Neuro: Alert and oriented X 3. Moves all extremities spontaneously. Psych: Normal affect.  Labs     Chemistry  Recent Labs Lab 05/16/16 0411 05/17/16 0449 05/18/16 0424  NA 139 139 141  K 3.9 3.9 4.0  CL 103 103 101  CO2 27 27 28   GLUCOSE 126* 125* 104*  BUN 24* 21* 19  CREATININE 0.96 0.94 0.81  CALCIUM 8.9 8.7* 8.9  PROT 6.5 6.2* 6.1*  ALBUMIN 3.2* 3.1* 3.0*  AST 20 20 19   ALT 18 20 21   ALKPHOS 53 54 53  BILITOT 0.5 0.5 0.6  GFRNONAA 56* 58* >60  GFRAA >60 >60 >60  ANIONGAP 9 9 12      Hematology  Recent Labs Lab 05/16/16 0411 05/17/16 0449 05/18/16 0424  WBC 12.0* 11.7* 10.9*  RBC 4.61 4.49 4.20  HGB 13.3 12.8 12.0  HCT 41.1 40.1 37.8  MCV 89.2 89.3 90.0  MCH 28.9 28.5 28.6  MCHC 32.4 31.9 31.7  RDW 15.4 15.4 15.2  PLT 345 338 327     Radiology    No results found.  Cardiac Studies   LV EF: 45% -   50%  -------------------------------------------------------------------  Indications:      Atrial fibrillation - 427.31.  ------------------------------------------------------------------- History:   Risk factors:  Hypertension. Diabetes mellitus.  ------------------------------------------------------------------- Study Conclusions  - Left ventricle: The cavity size was normal. Wall thickness was   normal. Systolic function was mildly reduced. The estimated   ejection fraction was in the range of 45% to 50%. - Mitral valve: No evidence of vegetation. There was mild to   moderate regurgitation. - Left atrium: No evidence of thrombus in the appendage. - Right ventricle: RV systolic pressure (S, est): 44 mm Hg. - Tricuspid valve: Mildly thickened leaflets. There was   moderate-severe regurgitation. - Pulmonary arteries: Systolic pressure was moderately increased.   PA peak pressure: 44 mm Hg (S).   Patient Profile     76 y.o. female with HTN, DM, arthritis and no cardiac history who presented for palpitations of about a week causing dyspnea with light exertion preceded by cough symptoms for about a week prior. Cardiology was  consulted for atrial flutter with RVR.  Assessment & Plan    1. Atrial flutter: S/p DCCV and now in sinus rhythm. Study EF is decreased at 45-50% with elevated PA pressures but this is in the setting of atrial flutter. Echo will eventually need to be repeated in the future once in controlled rhythm. Further workup can be done based on that. Aortic atherosclerosis noted on radiography. -Can go home today from cardiac standpoint -Can d/c diltiazem and start oral metoprolol or coreg for B-blocker -Eliquis 5mg  BID -Treatment will be reassessed in clinic F/U with Dr. Antoine Poche  2. Mild systolic CHF:  Mild EF decrease without heart history maybe rate related with Afib at time of study. I would recheck an echo in several weeks to see if her EF has normalized. If not, would start further work up for CHF. -She will follow up with Dr. Antoine Poche  3. Hypertension: Well controlled. -Would preferentially maintain lisinopril if others amlodipine/HCTZ need titration.  4. Diabetes: Well controlled -Management per primary team   Fuller Plan, MD PGY-II Internal Medicine Resident Pager# [phone] 05/18/2016, 10:46 AM  Attending Note:   The patient was seen and examined.  Agree with assessment and plan as noted above.  Changes made to the above note as needed.  Patient seen and independently examined with CHris Rice, PGY-2.   We discussed all aspects of the encounter. I agree with the assessment and plan as stated above.  1.   Atrial fibrillation: She has maintained sinus rhythm. We will continue with her same medications. We should use a beta blocker for rate control set she has acute systolic congestive heart failure. I would add diltiazem only if absolutely necessary for better rate control.  2. Acute systolic congestive heart failure: This is likely rate related. We'll see if she improves significantly now that she is in sinus rhythm. We will continue with the beta blocker and ACE  inhibitor. Further workup can be done as an outpatient by Dr. Antoine Poche .     I have spent a total of 40 minutes with patient reviewing hospital  notes , telemetry, EKGs, labs and examining patient as well as establishing an assessment and plan that was discussed with the patient. > 50% of time was spent in direct patient care.    Vesta Mixer, Montez Hageman., MD, [facility] 05/18/2016, 12:54 PM [address],  [address] Office [phone] Pager [phone]

## 2016-05-18 NOTE — Discharge Summary (Signed)
Physician Discharge Summary  Alexandra Green FAO:[id number] DOB: 1941/01/21 DOA: 05/14/2016  PCP: Geraldo Pitter, MD  Admit date: 05/14/2016 Discharge date: 05/18/2016  Admitted From: Home Disposition: Home  Recommendations for Outpatient Follow-up:  1. Follow up with PCP in 1-2 weeks (appointment on 05/27/16 at 6:30 pm) 2. Follow up with [facility] Cardiology Dr. Antoine Poche as an outpatient and have ECHO repeated as an outpatient 3. Have outpatient sleep study to check for Obstructive Sleep Apnea 4. Please obtain BMP/CBC in one week  Home Health: No Equipment/Devices: None  Discharge Condition: Stable CODE STATUS: FULL CODE Diet recommendation: Heart Healthy / Carb Modified  Brief/Interim Summary: Jahniah L Matthewsis a 76 y.o.femalewith medical history significant of HTN, DM, arthritis who presents for SOB for about 6 days. She first noticed this symptom when she was coming down the steps from church on Sunday and she was SOB and had to stop to rest. This is not normal for her. Intermittently throughout the week, she would have SOB and also have episodes of palpitations and feeling like her heart was flip-flopping. She has had no preceding illness except a mild cough. She has not changed her diet and drinks only one cup of coffee day and decaffeinated tea at night. This is not new for her. No new medications, supplements or energy drinks. She denies lightheadedness, chest pain, dizziness. She continues to sleep with the same number of pillows and has no PND. She does have some mild swelling in her legs and some chronic mild constipation. She was found in clinic to be in Afib with RVR and sent to the Ed. Pateint was noted to be in A Flutter with 2:1 conduction in the ED and placed on Cardizem gtt and admitted to the Hospital. When asked patient states she can feel her heart rate get irregular. Patient is off Cardizem gtt and now on oral Diltiazem and Cardiology saw and evaluated  patient and she underwent successful TTE and Cardiversion with synchronized biphasic defibrillation with 120 joules on 05/17/16. Steadily improved and ok to D/C for Cardiology standpoint. Patient was changed from Diltiazem to Coreg at Discharge and Amlodipine was stopped as BP was well controlled. At this time patient was deemed medically stable to be D/C'd home and will follow up with PCP and with Cardiology Dr. Antoine Poche as an outpatient.    Discharge Diagnoses:  Principal Problem:   New onset atrial flutter (HCC) Active Problems:   Essential hypertension   Diabetes mellitus without complication (HCC)   Leukocytosis   Acute on chronic combined systolic and diastolic CHF (congestive heart failure) (HCC)  New Onset Atrial Flutter with 2:1 and 3:1 Conduction s/p successful Cardioversion to NSR done by Dr. Anne Fu -Had Rapid Ventricular Response on admission; EKG this AM showed NSR at a rate of 85 -EKG with flutter and this appears consistent with telemetry findings -Rate controlled with cardizem ggt, however, with activity she did become tachycardic again -Continued Telemetry; Cardizem gtt Discontinued -Patient initially placed on 30 mg of Cardizem q6h however switched to Long Acting Cardizem 240 mg po Daily and then 120 mg po Daily; Based on ECHO findings, Patient was started on BB with Coreg 6.25 mg po BID -Changed Cardizem 120 mg po Daily to Coreg 6.25 mg po BID per Cardiology Reccs -ECHOcardiogram showed LVEF is mildly decreased at 40-45% with diffuse hypokinesis. Mild pulmonary hypertension, RVSP 40 mmHg -Given high CHADS-VASC (5) - C/w Eliquis 5 mg po BID; Case Management involved to check price per month; Given Free  30 day card -No chest pain, so did not trend troponin. Would do so if she developed chest pain -Continue to Monitor and defer to Cardiology recommended changing Diltazem to Beta Blocker  -Cardiology to set patient up with TEE/DCCV and under went successful Cardioversion  05/17/16 -Follow up with Dr. Antoine Poche of [facility] as an outpateint -Has PCP appointment with Dr. Parke Simmers on 05/27/16 at 6:30 pm  Acute Systolic CHF with EF of 45-50% on TEE and 40-45% on TTE with Diffuse Hypokinesis  -Currently not Decompensated or in Exacerbation; ? Rate Related  -Had some minor swelling so was given 1x dose of IV Lasix prior to Discharge -Cardiology Following and will defer to them for when to do ischemic workup; Will need Repeat ECHO as an outpatient  -Strict I's and O's, Daily Weights, and Fluid Restriction at 1500 mL/day -Home Meds of Lisinopril-HCTZ restarted  -BNP 155.3 -Added Coreg 6.25 mg po BID and discontinued Diltazem 120 mg XR -D/C'd ASA 81 mg daily per Cardiology -Follow up with Cardiology Dr. Antoine Poche as an outpatient and have repeat ECHO  Mild Leukocytosis, ? Related to Sinus Infection -No evidence of current Infection but patient states she was having sinus issues recently -Added Fluticasone 1 spray BID -WBC went from 11.7 -> 11.3 -> 12.0 -> 11.7 -> 10.9 -Monitor for S/Sx of Infection; States she was coughing yesterday but not productive -Repeat CBC as an outpatient   Hypertension -Blood Pressures were well controlled  -Home Amlodipine 10 mg and Lisinopril-HCTZ 20-25 mg held as patient was started on Diltazem gtt -Restarted Lisinopril-HCTZ; Discontinued Amlodipine and can follow up as an outpatient if needs to be added again -Diltiazem120 mg po Daily changed to Coreg 6.25 mg po BID based on Cardiology Recc's -Will need Outpatient follow up with PCP and Cardiology  Non-Insulin Dependent Diabetes Mellitus -C/w Home Meds of Glimperide and Metformin -Hb A1c was 6.3 -CBG's have been running from 106-154  Osteoarthritis of the Knees -C/w Acetaminophen 650 mg po q4hprn for Mild Pain  Morbid Obesity -BMI 42.7 -Weight Loss Counseling given  Likely Sleep Apnea -Outpatient OSA Study; -PCP to Refer Discharge Instructions  Allergies as of  05/18/2016   No Known Allergies     Medication List    STOP taking these medications   amLODipine 10 MG tablet Commonly known as:  NORVASC   aspirin EC 81 MG tablet     TAKE these medications   apixaban 5 MG Tabs tablet Commonly known as:  ELIQUIS Take 1 tablet (5 mg total) by mouth 2 (two) times daily.   carvedilol 6.25 MG tablet Commonly known as:  COREG Take 1 tablet (6.25 mg total) by mouth 2 (two) times daily with a meal.   Fish Oil 1000 MG Caps Take 2 capsules by mouth daily.   fluticasone 50 MCG/ACT nasal spray Commonly known as:  FLONASE Place 1 spray into both nostrils daily.   glimepiride 4 MG tablet Commonly known as:  AMARYL Take 4 mg by mouth daily with breakfast.   lisinopril-hydrochlorothiazide 20-25 MG tablet Commonly known as:  PRINZIDE,ZESTORETIC Take 1 tablet by mouth daily.   metFORMIN 500 MG tablet Commonly known as:  GLUCOPHAGE Take 500 mg by mouth 2 (two) times daily with a meal.   multivitamin with minerals Tabs tablet Take 2 tablets by mouth daily.      Follow-up Information    Geraldo Pitter, MD. Call in 1 week(s).   Specialty:  Family Medicine Why:  05/27/16 at 6:30 pm Contact information:  [address] [place] [address] [phone]          No Known Allergies  Consultations:  Cardiology [facility]  Procedures/Studies: Dg Chest 2 View  Result Date: 05/14/2016 CLINICAL DATA:  Initial evaluation for atrial fibrillation. EXAM: CHEST  2 VIEW COMPARISON:  Prior radiograph from 05/04/2005. FINDINGS: Mild cardiomegaly. Mediastinal silhouette within normal limits. Aortic atherosclerosis noted. Elevation the right hemidiaphragm, similar to previous, likely chronic. No focal infiltrate, pulmonary edema, or pleural effusion. No pneumothorax. Possible minimal subsegmental atelectasis at the right lung base noted. No pneumothorax. No acute osseous abnormality. Multilevel degenerative spondylolysis noted within the visualized  spine. IMPRESSION: 1. No active cardiopulmonary disease. 2. Chronic elevation of the right hemidiaphragm. 3. Aortic atherosclerosis. Electronically Signed   By: Rise Mu M.D.   On: 05/14/2016 19:02    TransThoracic ECHOCardiogram - Study Conclusions  - Left ventricle: The cavity size was normal. There was moderate concentric hypertrophy. Systolic function was mildly to moderately reduced. The estimated ejection fraction was in the range of 40% to 45%. Diffuse hypokinesis. - Aortic valve: Trileaflet; normal thickness leaflets. There was no regurgitation. - Mitral valve: Calcified annulus. Mildly thickened leaflets . There was mild regurgitation. - Left atrium: The atrium was mildly dilated. - Right ventricle: Systolic function was normal. - Right atrium: The atrium was mildly dilated. - Tricuspid valve: There was moderate regurgitation. - Pulmonic valve: There was mild regurgitation. - Pulmonary arteries: Systolic pressure was mildly increased. PA peak pressure: 40 mm Hg (S). - Inferior vena cava: The vessel was dilated. The respirophasic diameter changes were blunted (<50%), consistent with elevated central venous pressure. - Pericardium, extracardiac: There was no pericardial effusion.  Impressions:  - LVEF is mildly decreased at 40-45% with diffuse hypokinesis. Mild pulmonary hypertension, RVSP 40 mmHg.  TransEsophageal ECHOCARDIOGRAM Study Conclusions  - Left ventricle: The cavity size was normal. Wall thickness was normal. Systolic function was mildly reduced. The estimated ejection fraction was in the range of 45% to 50%. - Mitral valve: No evidence of vegetation. There was mild to moderate regurgitation. - Left atrium: No evidence of thrombus in the appendage. - Right ventricle: RV systolic pressure (S, est): 44 mm Hg. - Tricuspid valve: Mildly thickened leaflets. There was moderate-severe regurgitation. - Pulmonary  arteries: Systolic pressure was moderately increased. PA peak pressure: 44 mm Hg (S).  Subjective: Seen and examined at bedside. No complaints and thinks she has sinus problems. Had mild swelling in legs so was given 1x of IV lasix prior to D/C. No other concerns or complaints and was advised to follow up with PCP, Cardiology and obtain a sleep study as an outpatient.   Discharge Exam: Vitals:   05/18/16 0457 05/18/16 0911  BP: (!) 109/52 119/67  Pulse: 79   Resp: 18   Temp: 98.4 F (36.9 C)    Vitals:   05/17/16 1206 05/17/16 2149 05/18/16 0457 05/18/16 0911  BP: 122/76 (!) 111/55 (!) 109/52 119/67  Pulse: 84 81 79   Resp: 18 18 18    Temp: 98.4 F (36.9 C) 98 F (36.7 C) 98.4 F (36.9 C)   TempSrc: Oral Oral Oral   SpO2: 98% 98% 98%   Weight:   121.1 kg (267 lb)   Height:       General: Pt is alert, awake, not in acute distress Cardiovascular: RRR, S1/S2 +, no rubs, no gallops Respiratory: CTA bilaterally, no wheezing, no rhonchi; Patient not tachypenic or using any accessory muscles to breathe Abdominal: Soft,  NT, ND, bowel sounds + Extremities: Mild Lower Extremity pedal edema, no cyanosis  The results of significant diagnostics from this hospitalization (including imaging, microbiology, ancillary and laboratory) are listed below for reference.    Microbiology: No results found for this or any previous visit (from the past 240 hour(s)).   Labs: BNP (last 3 results)  Recent Labs  05/17/16 1240  BNP 155.3*   Basic Metabolic Panel:  Recent Labs Lab 05/14/16 1800 05/15/16 0320 05/16/16 0411 05/17/16 0449 05/18/16 0424  NA 141 141 139 139 141  K 4.7 3.7 3.9 3.9 4.0  CL 101 104 103 103 101  CO2 27 25 27 27 28   GLUCOSE 111* 110* 126* 125* 104*  BUN 18 15 24* 21* 19  CREATININE 0.83 0.80 0.96 0.94 0.81  CALCIUM 9.7 9.0 8.9 8.7* 8.9  MG  --   --  1.8 1.7 1.8  PHOS  --   --  4.2 3.9 5.1*   Liver Function Tests:  Recent Labs Lab 05/16/16 0411  05/17/16 0449 05/18/16 0424  AST 20 20 19   ALT 18 20 21   ALKPHOS 53 54 53  BILITOT 0.5 0.5 0.6  PROT 6.5 6.2* 6.1*  ALBUMIN 3.2* 3.1* 3.0*   No results for input(s): LIPASE, AMYLASE in the last 168 hours. No results for input(s): AMMONIA in the last 168 hours. CBC:  Recent Labs Lab 05/14/16 1800 05/15/16 0320 05/16/16 0411 05/17/16 0449 05/18/16 0424  WBC 11.7* 11.3* 12.0* 11.7* 10.9*  NEUTROABS  --  7.7 7.4 7.1 6.9  HGB 15.2* 13.6 13.3 12.8 12.0  HCT 46.4* 41.9 41.1 40.1 37.8  MCV 90.1 89.3 89.2 89.3 90.0  PLT 346 328 345 338 327   Cardiac Enzymes: No results for input(s): CKTOTAL, CKMB, CKMBINDEX, TROPONINI in the last 168 hours. BNP: Invalid input(s): POCBNP CBG:  Recent Labs Lab 05/17/16 0758 05/17/16 1127 05/17/16 1615 05/17/16 2100 05/18/16 0755  GLUCAP 122* 124* 123* 154* 111*   D-Dimer No results for input(s): DDIMER in the last 72 hours. Hgb A1c No results for input(s): HGBA1C in the last 72 hours. Lipid Profile No results for input(s): CHOL, HDL, LDLCALC, TRIG, CHOLHDL, LDLDIRECT in the last 72 hours. Thyroid function studies No results for input(s): TSH, T4TOTAL, T3FREE, THYROIDAB in the last 72 hours.  Invalid input(s): FREET3 Anemia work up No results for input(s): VITAMINB12, FOLATE, FERRITIN, TIBC, IRON, RETICCTPCT in the last 72 hours. Urinalysis No results found for: COLORURINE, APPEARANCEUR, LABSPEC, PHURINE, GLUCOSEU, HGBUR, BILIRUBINUR, KETONESUR, PROTEINUR, UROBILINOGEN, NITRITE, LEUKOCYTESUR Sepsis Labs Invalid input(s): PROCALCITONIN,  WBC,  LACTICIDVEN Microbiology No results found for this or any previous visit (from the past 240 hour(s)).  Time coordinating discharge: Over 30 minutes  SIGNED:  Merlene Laughter, DO [facility] 05/18/2016, 10:40 AM Pager [phone]  If 7PM-7AM, please contact night-coverage [email or web address] Password [email or web address]

## 2016-05-18 NOTE — Addendum Note (Signed)
Addendum  created 05/18/16 2246 by Dorris Singh, MD   Sign clinical note

## 2016-05-18 NOTE — Progress Notes (Addendum)
Benefit check in progress for the co pay for Eliquis; Coupon card given to patient with explanation of usageAlexis Green [phone]   05/18/2016 12:29 pm  RE: Benefit check  Received: Today  Message Contents  Mardene Sayer CMA        S/W  MARGARET @ [employer] [facility] # [phone]    ELIQUIS  5 MG BID    COVER- YES  CO-PAY- $ 45.00  Q/ L OF 2 PER DAYS  TIER- 3 DRUG  PRIOR APPROVAL- NO  PHARMACY: [facility]

## 2016-05-18 NOTE — Anesthesia Postprocedure Evaluation (Addendum)
Anesthesia Post Note  Patient: Alexandra Green  Procedure(s) Performed: Procedure(s) (LRB): TRANSESOPHAGEAL ECHOCARDIOGRAM (TEE) (N/A) CARDIOVERSION (N/A)  Patient location during evaluation: PACU Anesthesia Type: General Level of consciousness: awake Pain management: pain level controlled Vital Signs Assessment: post-procedure vital signs reviewed and stable Cardiovascular status: stable Anesthetic complications: no       Last Vitals:  Vitals:   05/18/16 0911 05/18/16 1212  BP: 119/67 108/62  Pulse:  70  Resp:  18  Temp:  37.1 C    Last Pain:  Vitals:   05/18/16 1212  TempSrc: Oral  PainSc:                  Keerthana Vanrossum

## 2016-05-24 ENCOUNTER — Other Ambulatory Visit: Payer: Self-pay

## 2016-05-24 NOTE — Patient Outreach (Signed)
[facility] Care Management  05/24/2016  Alexandra Green 27-Jun-1940 [id number]       EMMI-HF RED ON EMMI ALERT Day # 3 Date: 05/22/16 Red Alert Reason: " Weighed themselves today? No"    Outreach attempt #1 to patient. No answer at present. RN CM left HIPAA compliant voicemail message along with contact info.      Plan: RN CM will make outreach call to patient within one business day if no return call from patient.  [name], RN,BSN,CCM [facility] Care Management Telephonic Care Management Coordinator Direct Phone: [phone] Toll Free: [phone] Fax: [phone]

## 2016-05-25 ENCOUNTER — Other Ambulatory Visit: Payer: Self-pay

## 2016-05-25 NOTE — Patient Outreach (Signed)
[facility]) Care Management  05/25/2016  SAE EUTSLER 09/09/1940 [id number]   EMMI-HF RED ON EMMI ALERT Day # 3 Date: 05/22/16 Red Alert Reason: " Weighed themselves today? No"   Incoming call from patient. Spoke with patient and reviewed and addressed red alert. patient reports that she has scale in the home and has been weighing daily. She denies any issues with weight gain, fluid retention and SOB. She states that she has actually lost 1.5lbs within the past two days. She reports that she is doing and feeling fine since return home. Appts in place. No issues with meds or transportation. Discussed and reviewed with patient s/s of worsening condition and when to seek medical attention. She voiced understanding. No further RN CM needs or concerns at this time. Advised patient that she would continue to get automated  post discharge EMMI HF calls for duration of 45 day EMMI program and if any of her responses were abnormal then she would receive a call from a nurse. She voiced understanding and was appreciative of call.     Plan: RN CM will notify [facility] administrative assistant of case status.  [name], RN,BSN,CCM [facility] Care Management Telephonic Care Management Coordinator Direct Phone: [phone] Toll Free: [phone] Fax: [phone]

## 2016-05-25 NOTE — Progress Notes (Signed)
Cardiology Office Note   Date:  05/27/2016   ID:  Alexandra Green, DOB [birth year], MRN [id number]  PCP:  Geraldo Pitter, MD  Cardiologist:   Rollene Rotunda, MD    Chief Complaint  Patient presents with  . Atrial Flutter      History of Present Illness: Alexandra Green is a 76 y.o. female who presents for follow-up of atrial flutter and cardioversion.  The patient was hospitalized last week with shortness of breath and palpitations.  She underwent TEE cardioversion. She was treated with anticoagulation.  She did have a mildly reduced ejection fraction of 40-45% with diffuse hypokinesis. There was some moderate tricuspid regurgitation. She had mildly elevated pulmonary pressures.  Since going home from the hospital she's done well. The patient denies any new symptoms such as chest discomfort, neck or arm discomfort. There has been no new shortness of breath, PND or orthopnea. There have been no reported palpitations, presyncope or syncope.  She gets around slowly with a quad cane  Past Medical History:  Diagnosis Date  . Arthritis   . Diabetes mellitus without complication (HCC)   . Hypertension     Past Surgical History:  Procedure Laterality Date  . ABDOMINAL HYSTERECTOMY    . CARDIOVERSION N/A 05/17/2016   Procedure: CARDIOVERSION;  Surgeon: Jake Bathe, MD;  Location: [facility] ENDOSCOPY;  Service: Cardiovascular;  Laterality: N/A;  . CATARACT EXTRACTION    . CHOLECYSTECTOMY    . TEE WITHOUT CARDIOVERSION N/A 05/17/2016   Procedure: TRANSESOPHAGEAL ECHOCARDIOGRAM (TEE);  Surgeon: Jake Bathe, MD;  Location: [facility] ENDOSCOPY;  Service: Cardiovascular;  Laterality: N/A;  . TOTAL KNEE ARTHROPLASTY Left      Current Outpatient Prescriptions  Medication Sig Dispense Refill  . apixaban (ELIQUIS) 5 MG TABS tablet Take 1 tablet (5 mg total) by mouth 2 (two) times daily. 60 tablet 0  . carvedilol (COREG) 6.25 MG tablet Take 1 tablet (6.25 mg total) by mouth 2 (two) times daily with a  meal. 60 tablet 0  . fluticasone (FLONASE) 50 MCG/ACT nasal spray Place 1 spray into both nostrils daily. 16 g 2  . glimepiride (AMARYL) 4 MG tablet Take 4 mg by mouth daily with breakfast.    . lisinopril-hydrochlorothiazide (PRINZIDE,ZESTORETIC) 20-25 MG tablet Take 1 tablet by mouth daily.    . metFORMIN (GLUCOPHAGE) 500 MG tablet Take 500 mg by mouth 2 (two) times daily with a meal.    . Multiple Vitamin (MULTIVITAMIN WITH MINERALS) TABS tablet Take 2 tablets by mouth daily.    . Omega-3 Fatty Acids (FISH OIL) 1000 MG CAPS Take 2 capsules by mouth daily.    . carvedilol (COREG) 3.125 MG tablet Take 1 tablet (3.125 mg total) by mouth 2 (two) times daily. To take with your 6.25 mg twice a day 60 tablet 9   No current facility-administered medications for this visit.     Allergies:   Patient has no known allergies.    ROS:  Please see the history of present illness.   Otherwise, review of systems are positive for none.   All other systems are reviewed and negative.    PHYSICAL EXAM: VS:  BP 136/74   Pulse 72   Ht 5\' 6"  (1.676 m)   Wt 261 lb 6.4 oz (118.6 kg)   BMI 42.19 kg/m  , BMI Body mass index is 42.19 kg/m. GEN:  No distress NECK:  No jugular venous distention at 90 degrees, waveform within normal limits, carotid upstroke brisk and  symmetric, no bruits, no thyromegaly LYMPHATICS:  No cervical adenopathy LUNGS:  Clear to auscultation bilaterally BACK:  No CVA tenderness CHEST:  Unremarkable HEART:  S1 and S2 within normal limits, no S3, no S4, no clicks, no rubs, 2/6 apical systolic murmur radiating out the outflow tract, no diastolic murmurs ABD:  Positive bowel sounds normal in frequency in pitch, no bruits, no rebound, no guarding, unable to assess midline mass or bruit with the patient seated. EXT:  2 plus pulses throughout, moderate edema, no cyanosis no clubbing SKIN:  No rashes no nodules NEURO:  Cranial nerves II through XII grossly intact, motor grossly intact  throughout PSYCH:  Cognitively intact, oriented to person place and time   EKG:  EKG is not ordered today.   Recent Labs: 05/14/2016: TSH 1.711 05/17/2016: B Natriuretic Peptide 155.3 05/18/2016: ALT 21; BUN 19; Creatinine, Ser 0.81; Hemoglobin 12.0; Magnesium 1.8; Platelets 327; Potassium 4.0; Sodium 141    Lipid Panel No results found for: CHOL, TRIG, HDL, CHOLHDL, VLDL, LDLCALC, LDLDIRECT    Wt Readings from Last 3 Encounters:  05/26/16 261 lb 6.4 oz (118.6 kg)  05/18/16 267 lb (121.1 kg)      Other studies Reviewed: Additional studies/ records that were reviewed today include: Hospital records. Review of the above records demonstrates:  Please see elsewhere in the note.     ASSESSMENT AND PLAN:  ATRIAL FLUTTER: She's had no symptomatic recurrence.  Alexandra Green has a CHA2DS2 - VASc score of 4 with a risk of stroke of 4%.    She tolerates anticoagulatio no change in therapy is indicated.   CARDIOMYOPATHY:   I will titrate the carvedilol by adding another 3.125 mg twice daily. She'll come back in about a month for another med titration. I will follow-up in the future with echocardiography.  HTN:  The blood pressure is at target. No change in medications is indicated. We will continue with therapeutic lifestyle changes (TLC).   Current medicines are reviewed at length with the patient today.  The patient does not have concerns regarding medicines.  The following changes have been made:  no change  Labs/ tests ordered today include: none No orders of the defined types were placed in this encounter.    Disposition:   FU with APP in one month.     Signed, Rollene Rotunda, MD  05/27/2016 5:06 PM    [facility]

## 2016-05-25 NOTE — Patient Outreach (Signed)
[facility]) Care Management  05/25/2016  Alexandra Green 04-Aug-1940 [id number]   EMMI-HF RED ON EMMI ALERT Day # 3 Date: 05/22/16 Red Alert Reason: " Weighed themselves today? No"   Voicemail message received from patient. Return call placed to patient. No answer at present. RN CM left HIPAA compliant voicemail message along with contact info.      Plan: RN CM will send unsuccessful outreach letter to patient and close case if no response from patient within 10 business days.   [name], RN,BSN,CCM [facility] Care Management Telephonic Care Management Coordinator Direct Phone: [phone] Toll Free: [phone] Fax: [phone]

## 2016-05-26 ENCOUNTER — Telehealth: Payer: Self-pay | Admitting: Cardiology

## 2016-05-26 ENCOUNTER — Encounter: Payer: Self-pay | Admitting: Cardiology

## 2016-05-26 ENCOUNTER — Ambulatory Visit (INDEPENDENT_AMBULATORY_CARE_PROVIDER_SITE_OTHER): Payer: Medicare Other | Admitting: Cardiology

## 2016-05-26 VITALS — BP 136/74 | HR 72 | Ht 66.0 in | Wt 261.4 lb

## 2016-05-26 DIAGNOSIS — I428 Other cardiomyopathies: Secondary | ICD-10-CM | POA: Diagnosis not present

## 2016-05-26 DIAGNOSIS — I483 Typical atrial flutter: Secondary | ICD-10-CM | POA: Diagnosis not present

## 2016-05-26 MED ORDER — CARVEDILOL 3.125 MG PO TABS: 3.1250 mg | ORAL_TABLET | Freq: Two times a day (BID) | ORAL | 9 refills | Status: DC

## 2016-05-26 NOTE — Patient Instructions (Signed)
Medication Instructions:  START- Carvedilol 3.125 mg twice a day with your 6.25 mg  Labwork: None Ordered  Testing/Procedures: None Ordered  Follow-Up: Your physician recommends that you schedule a follow-up appointment in: 1 Month with Corine Shelter or Bjorn Loser Barrett   Any Other Special Instructions Will Be Listed Below (If Applicable).   If you need a refill on your cardiac medications before your next appointment, please call your pharmacy.

## 2016-05-26 NOTE — Telephone Encounter (Signed)
New message     Pt was seen today by Dr. Antoine Poche. She called to cancel appt on Monday 2/19 with Dr. Antoine Poche. She asked to check with the nurse to make sure that was ok. She has follow up appt scheduled for March 15th with Theodore Demark.

## 2016-05-26 NOTE — Telephone Encounter (Signed)
Pt advised cancellation was OK, as instructed by Dr. Antoine Poche she just needs to keep the 1 month APP f/u already scheduled. Pt voiced understanding and thanks. NFQ's.

## 2016-05-27 ENCOUNTER — Encounter: Payer: Self-pay | Admitting: Cardiology

## 2016-05-27 DIAGNOSIS — I10 Essential (primary) hypertension: Secondary | ICD-10-CM | POA: Diagnosis not present

## 2016-05-28 ENCOUNTER — Other Ambulatory Visit: Payer: Self-pay

## 2016-05-28 NOTE — Patient Outreach (Addendum)
[facility]) Care Management  05/28/2016  Alexandra Green [birth year] [id number]       EMMI- HF RED ON EMMI ALERT Day # 8 Date: 05/27/16 Red Alert Reason: " Went to follow up appt? No"   Red on EMMI issue was discussed during last call with pateint. Patient has MD f/u appts in place. She completed appt with cardiologist on yesterday.    Plan: RN CM will notify [facility] administrative assistant of case status.   [name], RN,BSN,CCM [facility] Care Management Telephonic Care Management Coordinator Direct Phone: [phone] Toll Free: [phone] Fax: [phone]

## 2016-05-31 ENCOUNTER — Ambulatory Visit: Payer: Medicare Other | Admitting: Cardiology

## 2016-06-10 ENCOUNTER — Other Ambulatory Visit: Payer: Self-pay | Admitting: Cardiology

## 2016-06-14 NOTE — Telephone Encounter (Signed)
°*  STAT* If patient is at the pharmacy, call can be transferred to refill team.   1. Which medications need to be refilled? (please list name of each medication and dose if known) coreg and eliquis   2. Which pharmacy/location (including street and city if local pharmacy) is medication to be sent to? [facility] [place] 3. Do they need a 30 day or 90 day supply? 30

## 2016-06-15 DIAGNOSIS — H40013 Open angle with borderline findings, low risk, bilateral: Secondary | ICD-10-CM | POA: Diagnosis not present

## 2016-06-24 ENCOUNTER — Ambulatory Visit (INDEPENDENT_AMBULATORY_CARE_PROVIDER_SITE_OTHER): Payer: Medicare Other | Admitting: Physician Assistant

## 2016-06-24 ENCOUNTER — Encounter: Payer: Self-pay | Admitting: Physician Assistant

## 2016-06-24 VITALS — BP 161/87 | HR 76 | Ht 66.0 in | Wt 262.8 lb

## 2016-06-24 DIAGNOSIS — I10 Essential (primary) hypertension: Secondary | ICD-10-CM

## 2016-06-24 DIAGNOSIS — I483 Typical atrial flutter: Secondary | ICD-10-CM

## 2016-06-24 DIAGNOSIS — Z7901 Long term (current) use of anticoagulants: Secondary | ICD-10-CM

## 2016-06-24 DIAGNOSIS — I429 Cardiomyopathy, unspecified: Secondary | ICD-10-CM | POA: Diagnosis not present

## 2016-06-24 MED ORDER — CARVEDILOL 12.5 MG PO TABS: 12.5000 mg | ORAL_TABLET | Freq: Two times a day (BID) | ORAL | 11 refills | Status: DC

## 2016-06-24 NOTE — Patient Instructions (Signed)
Medication Instructions:  INCREASE COREG TO 12.5 MG TWICE DAILY  If you need a refill on your cardiac medications before your next appointment, please call your pharmacy.  Labwork: NONE  Follow-Up: Your physician wants you to follow-up in: 2 MONTHS WITH DR [facility].   Special Instructions: OBTAIN BLOOD PRESSURE CUFF AND MONITOR BLOOD PRESSURE AT HOME  CONSIDER [email or web address] FOR CALORIE  MANAGEMENT  INCREASE ACTIVITY AS TOLERATED   CONSIDER SILVER SNEAKERS PROGRAM   Thank you for choosing [facility] [place]!!    RHONDA BARRETT, PA-C Marcelino Duster, LPN

## 2016-06-24 NOTE — Progress Notes (Signed)
Cardiology Office Note   Date:  06/24/2016   ID:  Alexandra Green, DOB June 18, 1940, MRN [id number]  PCP:  Geraldo Pitter, MD  Cardiologist:  Dr. Antoine Poche 05/26/2016  Theodore Demark, PA-C   Chief Complaint  Patient presents with  . Follow-up    15month    History of Present Illness: Alexandra Green is a 76 y.o. female with a history of atrial flutter, s/p TEE/DCCV 05/17/2016 w/ EF 40-45%, DM, HTN, OA. CHADS2VASC=4 (age x 2, DM, HTN). Mod TR by echo  05/26/2016 office visit, Dr. Antoine Poche increased her carvedilol and requested she come back in a month for ongoing evaluation and further med titration.  Alexandra Green presents for ongoing cardiology follow up and evaluation.   She has not had palpitations, does not think she has had any atrial fib. She has not had any bleeding issues on the Eliquis. She felt very symptomatic with the atrial fibrillation and feels she would note it if she was in it.  She does not get chest pain. She can do what walking she needs to do with a rollator, her bad knees limit her. She does have dyspnea on exertion, but does not feel this is changed much.  She has some occasional daytime edema, but does not wake with it. She has chronic orthopnea, but denies PND.  She is compliant with her medications, but has not had them today.  She has not been exercising recently because of her joint problems, but is willing to start. She has looked at going back to a gym where she used to prolonged, but is also interested in Silver sneakers.  She is interested in losing weight, but hasn't really been doing much about it. I mentioned that they're on Programs including [employer] that will help her count her calories and are freely use. She is interested in this.  She understands that increasing her activity and whatever ways or possibly her with her joint limitations, will improve her general health.   Past Medical History:  Diagnosis Date  . Arthritis     . Diabetes mellitus without complication (HCC)   . Hypertension     Past Surgical History:  Procedure Laterality Date  . ABDOMINAL HYSTERECTOMY    . CARDIOVERSION N/A 05/17/2016   Procedure: CARDIOVERSION;  Surgeon: Jake Bathe, MD;  Location: [facility] ENDOSCOPY;  Service: Cardiovascular;  Laterality: N/A;  . CATARACT EXTRACTION    . CHOLECYSTECTOMY    . TEE WITHOUT CARDIOVERSION N/A 05/17/2016   Procedure: TRANSESOPHAGEAL ECHOCARDIOGRAM (TEE);  Surgeon: Jake Bathe, MD;  Location: [facility] ENDOSCOPY;  Service: Cardiovascular;  Laterality: N/A;  . TOTAL KNEE ARTHROPLASTY Left     Current Outpatient Prescriptions  Medication Sig Dispense Refill  . carvedilol (COREG) 3.125 MG tablet Take 1 tablet (3.125 mg total) by mouth 2 (two) times daily. To take with your 6.25 mg twice a day 60 tablet 9  . carvedilol (COREG) 6.25 MG tablet Take 1 tablet (6.25 mg total) by mouth 2 (two) times daily with a meal. 60 tablet 11  . ELIQUIS 5 MG TABS tablet TAKE 1 TABLET BY MOUTH TWICE A DAY 60 tablet 5  . fluticasone (FLONASE) 50 MCG/ACT nasal spray Place 1 spray into both nostrils daily. 16 g 2  . glimepiride (AMARYL) 4 MG tablet Take 4 mg by mouth daily with breakfast.    . lisinopril-hydrochlorothiazide (PRINZIDE,ZESTORETIC) 20-25 MG tablet Take 1 tablet by mouth daily.    . metFORMIN (GLUCOPHAGE) 500 MG tablet Take  500 mg by mouth 2 (two) times daily with a meal.    . Multiple Vitamin (MULTIVITAMIN WITH MINERALS) TABS tablet Take 2 tablets by mouth daily.    . Omega-3 Fatty Acids (FISH OIL) 1000 MG CAPS Take 2 capsules by mouth daily.     No current facility-administered medications for this visit.     Allergies:   Patient has no known allergies.    Social History:  The patient  reports that she has never smoked. She has never used smokeless tobacco. She reports that she does not drink alcohol or use drugs.   Family History:  The patient's family history includes Cirrhosis in her father; Heart attack (age  of onset: 35) in her son; Heart failure (age of onset: 78) in her mother.    ROS:  Please see the history of present illness. All other systems are reviewed and negative.    PHYSICAL EXAM: VS:  BP (!) 161/87   Pulse 76   Ht 5\' 6"  (1.676 m)   Wt 262 lb 12.8 oz (119.2 kg)   BMI 42.42 kg/m  , BMI Body mass index is 42.42 kg/m. GEN: Well nourished, well developed, female in no acute distress  HEENT: normal for age  Neck:  JVDAt 7 cm, no carotid bruit, no masses Cardiac: RRR; soft murmur, no rubs, or gallops Respiratory:  Decreased breath sounds bases bilaterally, normal work of breathing GI: soft, nontender, nondistended, + BS MS: no deformity or atrophy; trace edema; distal pulses are 2+ in all 4 extremities   Skin: warm and dry, no rash Neuro:  Strength and sensation are intact Psych: euthymic mood, full affect   EKG:  EKG is ordered today.  ECHO: 05/17/2016 - Left ventricle: The cavity size was normal. Wall thickness was   normal. Systolic function was mildly reduced. The estimated   ejection fraction was in the range of 45% to 50%. - Mitral valve: No evidence of vegetation. There was mild to   moderate regurgitation. - Left atrium: No evidence of thrombus in the appendage. - Right ventricle: RV systolic pressure (S, est): 44 mm Hg. - Tricuspid valve: Mildly thickened leaflets. There was   moderate-severe regurgitation. - Pulmonary arteries: Systolic pressure was moderately increased.   PA peak pressure: 44 mm Hg (S).   Recent Labs: 05/14/2016: TSH 1.711 05/17/2016: B Natriuretic Peptide 155.3 05/18/2016: ALT 21; BUN 19; Creatinine, Ser 0.81; Hemoglobin 12.0; Magnesium 1.8; Platelets 327; Potassium 4.0; Sodium 141    Lipid Panel No results found for: CHOL, TRIG, HDL, CHOLHDL, VLDL, LDLCALC, LDLDIRECT   Wt Readings from Last 3 Encounters:  06/24/16 262 lb 12.8 oz (119.2 kg)  05/26/16 261 lb 6.4 oz (118.6 kg)  05/18/16 267 lb (121.1 kg)     Other studies  Reviewed: Additional studies/ records that were reviewed today include: Office notes, hospital records and testing.  ASSESSMENT AND PLAN:  1.  Non-Ischemic cardiomyopathy: Her weight is stable. She is encouraged to limit calories and sodium to make sure she does not build volume and to help her blood pressure. She is compliant with her medications and assures me that she will take them as soon as she can. We will increase her Coreg to 12.5 mg twice a day and follow her on this dose. I advised that I could increase her diuretic, but would have to change her from lisinopril/HCTZ to lisinopril alone and put her on Lasix for volume management. She feels that she can manage her volume without any additional med  changes.  She is not having any ischemic symptoms so I do not feel strongly that ischemic testing is needed. This may be a nonischemic cardiomyopathy from the atrial fibrillation or for blood pressure control.  2. Paroxysmal atrial flutter, status post TEE/cardioversion: Her heart rate is completely regular and her symptoms have greatly improved. She is very sure she is not having atrial flutter at this time. Continue carvedilol, increase the dose  3. Chronic anticoagulation: She is not having any bleeding issues on the Eliquis, continue this.  4. Hypertension: She is agreeable to obtaining a blood pressure cuff in order to monitor her blood pressure more closely at home. I feel she will do well with this. She is encouraged to contact us if she has any issues or concerns. She is to track her blood pressure and bring the readings in when she comes in for follow-up.   Current medicines are reviewed at length with the patient today.  The patient does not have concerns regarding medicines.  The following changes have been made:  Increase Coreg  Labs/ tests ordered today include:  No orders of the defined types were placed in this encounter.    Disposition:   FU with Dr.  Antoine Poche  Signed, Theodore Demark, PA-C  06/24/2016 5:35 PM     [facility] Phone: [phone]; Fax: [phone]  This note was written with the assistance of speech recognition software. Please excuse any transcriptional errors.

## 2016-07-26 DIAGNOSIS — E089 Diabetes mellitus due to underlying condition without complications: Secondary | ICD-10-CM | POA: Diagnosis not present

## 2016-07-26 DIAGNOSIS — E0800 Diabetes mellitus due to underlying condition with hyperosmolarity without nonketotic hyperglycemic-hyperosmolar coma (NKHHC): Secondary | ICD-10-CM | POA: Diagnosis not present

## 2016-07-26 DIAGNOSIS — I4891 Unspecified atrial fibrillation: Secondary | ICD-10-CM | POA: Diagnosis not present

## 2016-07-26 DIAGNOSIS — M130 Polyarthritis, unspecified: Secondary | ICD-10-CM | POA: Diagnosis not present

## 2016-08-25 NOTE — Progress Notes (Signed)
Cardiology Office Note   Date:  08/26/2016   ID:  Alexandra Green, DOB [birth year], MRN [id number]  PCP:  [name], MD  Cardiologist:   Rollene Rotunda, MD    Chief Complaint  Patient presents with  . Atrial Flutter      History of Present Illness: Alexandra Green is a 76 y.o. female who presents for follow-up of atrial flutter and cardioversion.  The patient was hospitalized in Feb with shortness of breath and palpitations.  She underwent TEE cardioversion. She was treated with anticoagulation.  She did have a mildly reduced ejection fraction of 40-45% with diffuse hypokinesis. There was some moderate tricuspid regurgitation. She had mildly elevated pulmonary pressures.  We have seen her for med titration and have been able to increase her Coreg.  Since I last saw her she has done well.  The patient denies any new symptoms such as chest discomfort, neck or arm discomfort. There has been no new shortness of breath, PND or orthopnea. There have been no reported palpitations, presyncope or syncope.   Past Medical History:  Diagnosis Date  . Arthritis   . Diabetes mellitus without complication (HCC)   . Hypertension     Past Surgical History:  Procedure Laterality Date  . ABDOMINAL HYSTERECTOMY    . CARDIOVERSION N/A 05/17/2016   Procedure: CARDIOVERSION;  Surgeon: Jake Bathe, MD;  Location: [facility] ENDOSCOPY;  Service: Cardiovascular;  Laterality: N/A;  . CATARACT EXTRACTION    . CHOLECYSTECTOMY    . TEE WITHOUT CARDIOVERSION N/A 05/17/2016   Procedure: TRANSESOPHAGEAL ECHOCARDIOGRAM (TEE);  Surgeon: Jake Bathe, MD;  Location: [facility] ENDOSCOPY;  Service: Cardiovascular;  Laterality: N/A;  . TOTAL KNEE ARTHROPLASTY Left      Current Outpatient Prescriptions  Medication Sig Dispense Refill  . carvedilol (COREG) 12.5 MG tablet Take 1 tablet (12.5 mg total) by mouth 2 (two) times daily with a meal. 180 tablet 3  . ELIQUIS 5 MG TABS tablet TAKE 1 TABLET BY MOUTH TWICE A DAY 60  tablet 5  . fluticasone (FLONASE) 50 MCG/ACT nasal spray Place 1 spray into both nostrils daily. 16 g 2  . glimepiride (AMARYL) 4 MG tablet Take 4 mg by mouth daily with breakfast.    . lisinopril-hydrochlorothiazide (PRINZIDE,ZESTORETIC) 20-25 MG tablet Take 1 tablet by mouth daily.    . metFORMIN (GLUCOPHAGE) 500 MG tablet Take 500 mg by mouth 2 (two) times daily with a meal.    . Multiple Vitamin (MULTIVITAMIN WITH MINERALS) TABS tablet Take 2 tablets by mouth daily.    . Omega-3 Fatty Acids (FISH OIL) 1000 MG CAPS Take 2 capsules by mouth daily.     No current facility-administered medications for this visit.     Allergies:   Patient has no known allergies.    ROS:  Please see the history of present illness.   Otherwise, review of systems are positive for none.   All other systems are reviewed and negative.    PHYSICAL EXAM: VS:  BP 125/68   Pulse 75   Ht 5\' 6"  (1.676 m)   Wt 259 lb 6.4 oz (117.7 kg)   SpO2 97%   BMI 41.87 kg/m  , BMI Body mass index is 41.87 kg/m.  GENERAL:  Well appearing NECK:  No jugular venous distention, waveform within normal limits, carotid upstroke brisk and symmetric, no bruits, no thyromegaly LUNGS:  Clear to auscultation bilaterally BACK:  No CVA tenderness CHEST:  Unremarkable HEART:  PMI not displaced or  sustained,S1 and S2 within normal limits, no S3, no S4,no clicks, no rubs, soft apical systolic non radiating murmur, no diastolic murmurs ABD:  Flat, positive bowel sounds normal in frequency in pitch, no bruits, no rebound, no guarding, no midline pulsatile mass, no hepatomegaly, no splenomegaly EXT:  2 plus pulses throughout, no edema, no cyanosis no clubbing  EKG:  EKG is not  ordered today.   Recent Labs: 05/14/2016: TSH 1.711 05/17/2016: B Natriuretic Peptide 155.3 05/18/2016: ALT 21; BUN 19; Creatinine, Ser 0.81; Hemoglobin 12.0; Magnesium 1.8; Platelets 327; Potassium 4.0; Sodium 141    Lipid Panel No results found for: CHOL, TRIG,  HDL, CHOLHDL, VLDL, LDLCALC, LDLDIRECT    Wt Readings from Last 3 Encounters:  08/26/16 259 lb 6.4 oz (117.7 kg)  06/24/16 262 lb 12.8 oz (119.2 kg)  05/26/16 261 lb 6.4 oz (118.6 kg)      Other studies Reviewed: Additional studies/ records that were reviewed today include: None Review of the above records demonstrates:       ASSESSMENT AND PLAN:  ATRIAL FLUTTER: Alexandra Green has a CHA2DS2 - VASc score of 4 with a risk of stroke of 4%.  She seems to be maintaining NSR.  No change in therapy.   CARDIOMYOPATHY:  She is euvolemic.  No change in therapy.  Her BP is low and I will not try med titration.   HTN:  The blood pressure is at target. No change in medications is indicated. We will continue with therapeutic lifestyle changes (TLC).  This is being managed in the context of treating his CHF  Current medicines are reviewed at length with the patient today.  The patient does not have concerns regarding medicines.  The following changes have been made:  none  Labs/ tests ordered today include: None No orders of the defined types were placed in this encounter.    Disposition:   FU with me in four months.    Signed, Rollene Rotunda, MD  08/26/2016 12:50 PM    [facility]

## 2016-08-26 ENCOUNTER — Ambulatory Visit (INDEPENDENT_AMBULATORY_CARE_PROVIDER_SITE_OTHER): Payer: Medicare Other | Admitting: Cardiology

## 2016-08-26 ENCOUNTER — Encounter: Payer: Self-pay | Admitting: Cardiology

## 2016-08-26 VITALS — BP 125/68 | HR 75 | Ht 66.0 in | Wt 259.4 lb

## 2016-08-26 DIAGNOSIS — I428 Other cardiomyopathies: Secondary | ICD-10-CM | POA: Diagnosis not present

## 2016-08-26 DIAGNOSIS — I483 Typical atrial flutter: Secondary | ICD-10-CM | POA: Diagnosis not present

## 2016-08-26 HISTORY — DX: Other cardiomyopathies: I42.8

## 2016-08-26 MED ORDER — CARVEDILOL 12.5 MG PO TABS: 12.5000 mg | ORAL_TABLET | Freq: Two times a day (BID) | ORAL | 3 refills | Status: DC

## 2016-08-26 NOTE — Patient Instructions (Signed)
Medication Instructions:  Continue current medications  Labwork: None Ordered  Testing/Procedures: None Ordered  Follow-Up: Your physician recommends that you schedule a follow-up appointment in: 4 Months   Any Other Special Instructions Will Be Listed Below (If Applicable).   If you need a refill on your cardiac medications before your next appointment, please call your pharmacy.   

## 2016-10-26 DIAGNOSIS — J399 Disease of upper respiratory tract, unspecified: Secondary | ICD-10-CM | POA: Diagnosis not present

## 2016-10-26 DIAGNOSIS — M310 Hypersensitivity angiitis: Secondary | ICD-10-CM | POA: Diagnosis not present

## 2016-10-26 DIAGNOSIS — I10 Essential (primary) hypertension: Secondary | ICD-10-CM | POA: Diagnosis not present

## 2016-10-26 DIAGNOSIS — M130 Polyarthritis, unspecified: Secondary | ICD-10-CM | POA: Diagnosis not present

## 2016-11-17 ENCOUNTER — Telehealth: Payer: Self-pay | Admitting: Cardiology

## 2016-11-17 NOTE — Telephone Encounter (Signed)
°*  STAT* If patient is at the pharmacy, call can be transferred to refill team.   1. Which medications need to be refilled? (please list name of each medication and dose if known)Eliquis ( Needs a new prescription sent )   2. Which pharmacy/location (including street and city if local pharmacy) is medication to be sent to?[facility] in [place]  3. Do they need a 30 day or 90 day supply? 30  Patient changed pharmacy

## 2016-11-18 MED ORDER — APIXABAN 5 MG PO TABS: 5.0000 mg | ORAL_TABLET | Freq: Two times a day (BID) | ORAL | 1 refills | Status: DC

## 2016-12-27 ENCOUNTER — Ambulatory Visit: Payer: Medicare Other | Admitting: Cardiology

## 2017-01-18 NOTE — Progress Notes (Signed)
Cardiology Office Note   Date:  01/19/2017   ID:  Alexandra Green, DOB 12-19-1940, MRN [id number]  PCP:  [name], MD  Cardiologist:   Rollene Rotunda, MD   atr Chief Complaint  Patient presents with  . Atrial Flutter      History of Present Illness: Alexandra Green is a 76 y.o. female who presents for follow-up of atrial flutter and cardioversion.  The patient was hospitalized in Feb with shortness of breath and palpitations.  She underwent TEE cardioversion. She was treated with anticoagulation.  She did have a mildly reduced ejection fraction of 40-45% with diffuse hypokinesis. There was some moderate tricuspid regurgitation. She had mildly elevated pulmonary pressures.  We have seen her for med titration.  At the last visit her BP would not allow med titration.     Today she returns and she is doing OK.  The patient denies any new symptoms such as chest discomfort, neck or arm discomfort. There has been no new shortness of breath, PND or orthopnea. There have been no reported palpitations, presyncope or syncope.  She gets around slowly with her walker.  Past Medical History:  Diagnosis Date  . Arthritis   . Diabetes mellitus without complication (HCC)   . Hypertension     Past Surgical History:  Procedure Laterality Date  . ABDOMINAL HYSTERECTOMY    . CARDIOVERSION N/A 05/17/2016   Procedure: CARDIOVERSION;  Surgeon: Jake Bathe, MD;  Location: [facility] ENDOSCOPY;  Service: Cardiovascular;  Laterality: N/A;  . CATARACT EXTRACTION    . CHOLECYSTECTOMY    . TEE WITHOUT CARDIOVERSION N/A 05/17/2016   Procedure: TRANSESOPHAGEAL ECHOCARDIOGRAM (TEE);  Surgeon: Jake Bathe, MD;  Location: [facility] ENDOSCOPY;  Service: Cardiovascular;  Laterality: N/A;  . TOTAL KNEE ARTHROPLASTY Left      Current Outpatient Prescriptions  Medication Sig Dispense Refill  . apixaban (ELIQUIS) 5 MG TABS tablet Take 1 tablet (5 mg total) by mouth 2 (two) times daily. 180 tablet 1  . carvedilol  (COREG) 12.5 MG tablet Take 1.5 tablets (18.75 mg total) by mouth 2 (two) times daily with a meal. 225 tablet 3  . fluticasone (FLONASE) 50 MCG/ACT nasal spray Place 1 spray into both nostrils daily. 16 g 2  . glimepiride (AMARYL) 4 MG tablet Take 4 mg by mouth daily with breakfast.    . lisinopril-hydrochlorothiazide (PRINZIDE,ZESTORETIC) 20-25 MG tablet Take 1 tablet by mouth daily.    . metFORMIN (GLUCOPHAGE) 500 MG tablet Take 500 mg by mouth 2 (two) times daily with a meal.    . Multiple Vitamin (MULTIVITAMIN WITH MINERALS) TABS tablet Take 2 tablets by mouth daily.    . Omega-3 Fatty Acids (FISH OIL) 1000 MG CAPS Take 2 capsules by mouth daily.     No current facility-administered medications for this visit.     Allergies:   Patient has no known allergies.    ROS:  Please see the history of present illness.   Otherwise, review of systems are positive for none.   All other systems are reviewed and negative.    PHYSICAL EXAM: VS:  BP (!) 164/90   Pulse 84   Ht  (1.676 m)   Wt 261 lb (118.4 kg)   SpO2 98%   BMI 42.13 kg/m  , BMI Body mass index is 42.13 kg/m.  GENERAL:  Well appearing NECK:  No jugular venous distention, waveform within normal limits, carotid upstroke brisk and symmetric, no bruits, no thyromegaly LUNGS:  Clear  to auscultation bilaterally CHEST:  Unremarkable HEART:  PMI not displaced or sustained,S1 and S2 within normal limits, no S3, no S4, no clicks, no rubs, soft apical murmur, no diastolic murmurs ABD:  Flat, positive bowel sounds normal in frequency in pitch, no bruits, no rebound, no guarding, no midline pulsatile mass, no hepatomegaly, no splenomegaly EXT:  2 plus pulses throughout, no edema, no cyanosis no c  EKG:  EKG  ordered today. Sinus rhythm, rate 75, axis left axis deviation, left ventricular hypertrophy, no acute ST-T wave changes.  Recent Labs: 05/14/2016: TSH 1.711 05/17/2016: B Natriuretic Peptide 155.3 05/18/2016: ALT 21; BUN 19;  Creatinine, Ser 0.81; Hemoglobin 12.0; Magnesium 1.8; Platelets 327; Potassium 4.0; Sodium 141    Lipid Panel No results found for: CHOL, TRIG, HDL, CHOLHDL, VLDL, LDLCALC, LDLDIRECT    Wt Readings from Last 3 Encounters:  01/19/17 261 lb (118.4 kg)  08/26/16 259 lb 6.4 oz (117.7 kg)  06/24/16 262 lb 12.8 oz (119.2 kg)      Other studies Reviewed: Additional studies/ records that were reviewed today include: None Review of the above records demonstrates:       ASSESSMENT AND PLAN:  ATRIAL FLUTTER: Alexandra Green has a CHA2DS2 - VASc score of 4 with a risk of stroke of 4%.  She is maintaining NSR.  CARDIOMYOPATHY:  Today I will increase the Coreg to 18,75 mg bid.  HTN:  This is being managed in the context of treating his CHF  Current medicines are reviewed at length with the patient today.  The patient does not have concerns regarding medicines.  The following changes have been made:  As above  Labs/ tests ordered today include:   Orders Placed This Encounter  Procedures  . EKG 12-Lead     Disposition:   FU with me in one month.    Signed, Rollene Rotunda, MD  01/19/2017 9:52 AM    [facility]

## 2017-01-19 ENCOUNTER — Ambulatory Visit (INDEPENDENT_AMBULATORY_CARE_PROVIDER_SITE_OTHER): Payer: Medicare Other | Admitting: Cardiology

## 2017-01-19 ENCOUNTER — Encounter: Payer: Self-pay | Admitting: Cardiology

## 2017-01-19 VITALS — BP 164/90 | HR 84 | Ht 66.0 in | Wt 261.0 lb

## 2017-01-19 DIAGNOSIS — I429 Cardiomyopathy, unspecified: Secondary | ICD-10-CM | POA: Diagnosis not present

## 2017-01-19 DIAGNOSIS — I4892 Unspecified atrial flutter: Secondary | ICD-10-CM | POA: Diagnosis not present

## 2017-01-19 DIAGNOSIS — I10 Essential (primary) hypertension: Secondary | ICD-10-CM | POA: Diagnosis not present

## 2017-01-19 MED ORDER — CARVEDILOL 12.5 MG PO TABS: 18.7500 mg | ORAL_TABLET | Freq: Two times a day (BID) | ORAL | 3 refills | Status: DC

## 2017-01-19 NOTE — Patient Instructions (Signed)
Medication Instructions:  INCREASE- Carvedilol 1 1/2 tablets twice a day  If you need a refill on your cardiac medications before your next appointment, please call your pharmacy.  Labwork: None Ordered HERE IN OUR OFFICE AT [facility]  Testing/Procedures: None Ordered  Follow-Up: Your physician wants you to follow-up in: 1 Months.     Thank you for choosing [facility] at [facility]!!

## 2017-01-27 ENCOUNTER — Telehealth: Payer: Self-pay | Admitting: Cardiology

## 2017-01-27 NOTE — Telephone Encounter (Signed)
[facility] that  Mrs. Riskin has a diagnosis of CHF and what is her Ejection Fraction . Please call and can leave a voicemail . Thanks

## 2017-01-27 NOTE — Telephone Encounter (Signed)
VM left w requested information, advised to call if further needs.

## 2017-02-23 DIAGNOSIS — M7130 Other bursal cyst, unspecified site: Secondary | ICD-10-CM | POA: Diagnosis not present

## 2017-02-23 DIAGNOSIS — I10 Essential (primary) hypertension: Secondary | ICD-10-CM | POA: Diagnosis not present

## 2017-02-23 DIAGNOSIS — E089 Diabetes mellitus due to underlying condition without complications: Secondary | ICD-10-CM | POA: Diagnosis not present

## 2017-02-23 DIAGNOSIS — M130 Polyarthritis, unspecified: Secondary | ICD-10-CM | POA: Diagnosis not present

## 2017-02-28 DIAGNOSIS — M1990 Unspecified osteoarthritis, unspecified site: Secondary | ICD-10-CM | POA: Insufficient documentation

## 2017-02-28 DIAGNOSIS — I10 Essential (primary) hypertension: Secondary | ICD-10-CM | POA: Insufficient documentation

## 2017-03-10 NOTE — Progress Notes (Signed)
Cardiology Office Note   Date:  03/11/2017   ID:  Alexandra Green, DOB June 21, 1940, MRN [id number]  PCP:  [name], MD  Cardiologist:   Rollene Rotunda, MD    Chief Complaint  Patient presents with  . Cardiomyopathy      History of Present Illness: Alexandra Green is a 76 y.o. female who presents for follow-up of atrial flutter and cardioversion.  The patient was hospitalized in Feb with shortness of breath and palpitations.  She underwent TEE cardioversion. She was treated with anticoagulation.  She did have a mildly reduced ejection fraction of 40-45% with diffuse hypokinesis. There was some moderate tricuspid regurgitation. She had mildly elevated pulmonary pressures.  We have seen her for med titration.  At the last visit I increased her Coreg.  She returns for follow up.  She did well with the increased beta blocker.  The patient denies any new symptoms such as chest discomfort, neck or arm discomfort. There has been no new shortness of breath, PND or orthopnea. There have been no reported palpitations, presyncope or syncope.   She gets around with her walker and drives and goes to the grocery store.   Past Medical History:  Diagnosis Date  . Arthritis   . Diabetes mellitus without complication (HCC)   . Hypertension     Past Surgical History:  Procedure Laterality Date  . ABDOMINAL HYSTERECTOMY    . CARDIOVERSION N/A 05/17/2016   Procedure: CARDIOVERSION;  Surgeon: Jake Bathe, MD;  Location: [facility] ENDOSCOPY;  Service: Cardiovascular;  Laterality: N/A;  . CATARACT EXTRACTION    . CHOLECYSTECTOMY    . TEE WITHOUT CARDIOVERSION N/A 05/17/2016   Procedure: TRANSESOPHAGEAL ECHOCARDIOGRAM (TEE);  Surgeon: Jake Bathe, MD;  Location: [facility] ENDOSCOPY;  Service: Cardiovascular;  Laterality: N/A;  . TOTAL KNEE ARTHROPLASTY Left      Current Outpatient Medications  Medication Sig Dispense Refill  . apixaban (ELIQUIS) 5 MG TABS tablet Take 1 tablet (5 mg total) by mouth 2  (two) times daily. 180 tablet 1  . carvedilol (COREG) 25 MG tablet Take 1 tablet (25 mg total) by mouth 2 (two) times daily with a meal. 180 tablet 3  . fluticasone (FLONASE) 50 MCG/ACT nasal spray Place 1 spray into both nostrils daily. 16 g 2  . glimepiride (AMARYL) 4 MG tablet Take 4 mg by mouth daily with breakfast.    . lisinopril-hydrochlorothiazide (PRINZIDE,ZESTORETIC) 20-25 MG tablet Take 1 tablet by mouth daily.    . metFORMIN (GLUCOPHAGE) 500 MG tablet Take 500 mg by mouth 2 (two) times daily with a meal.    . Multiple Vitamin (MULTIVITAMIN WITH MINERALS) TABS tablet Take 2 tablets by mouth daily.    . Omega-3 Fatty Acids (FISH OIL) 1000 MG CAPS Take 2 capsules by mouth daily.     No current facility-administered medications for this visit.     Allergies:   Patient has no known allergies.    ROS:  Please see the history of present illness.   Otherwise, review of systems are positive for none.   All other systems are reviewed and negative.    PHYSICAL EXAM: VS:  BP (!) 142/76   Pulse 74   Resp (!) 98   Ht 5' 5.5" (1.664 m)   Wt 258 lb (117 kg)   BMI 42.28 kg/m  , BMI Body mass index is 42.28 kg/m.  GENERAL:  Well appearing NECK:  No jugular venous distention, waveform within normal limits, carotid upstroke brisk  and symmetric, no bruits, no thyromegaly LUNGS:  Clear to auscultation bilaterally CHEST:  Unremarkable HEART:  PMI not displaced or sustained,S1 and S2 within normal limits, no S3, no S4, no clicks, no rubs, no murmurs ABD:  Flat, positive bowel sounds normal in frequency in pitch, no bruits, no rebound, no guarding, no midline pulsatile mass, no hepatomegaly, no splenomegaly EXT:  2 plus pulses throughout, no edema, no cyanosis no clubbing   EKG:  EKG not ordered today.   Recent Labs: 05/14/2016: TSH 1.711 05/17/2016: B Natriuretic Peptide 155.3 05/18/2016: ALT 21; BUN 19; Creatinine, Ser 0.81; Hemoglobin 12.0; Magnesium 1.8; Platelets 327; Potassium 4.0;  Sodium 141    Lipid Panel No results found for: CHOL, TRIG, HDL, CHOLHDL, VLDL, LDLCALC, LDLDIRECT    Wt Readings from Last 3 Encounters:  03/11/17 258 lb (117 kg)  01/19/17 261 lb (118.4 kg)  08/26/16 259 lb 6.4 oz (117.7 kg)      Other studies Reviewed: Additional studies/ records that were reviewed today include: None Review of the above records demonstrates:       ASSESSMENT AND PLAN:  ATRIAL FLUTTER: Alexandra Green has a CHA2DS2 - VASc score of 4 with a risk of stroke of 4%.  She is still in NSR.  No change in therapy.   CARDIOMYOPATHY:  Today I will increase the Coreg to 25 mg bid.   HTN:  This is being managed in the context of treating his CHF  Current medicines are reviewed at length with the patient today.  The patient does not have concerns regarding medicines.   The following changes have been made:  As above.   Labs/ tests ordered today include: None  No orders of the defined types were placed in this encounter.    Disposition:   FU with me in 2 months.    Signed, Rollene RotundaJames Tynia Wiers, MD  03/11/2017 9:14 AM    [facility]

## 2017-03-11 ENCOUNTER — Encounter: Payer: Self-pay | Admitting: Cardiology

## 2017-03-11 ENCOUNTER — Ambulatory Visit: Payer: Medicare Other | Admitting: Cardiology

## 2017-03-11 VITALS — BP 142/76 | HR 74 | Resp 98 | Ht 65.5 in | Wt 258.0 lb

## 2017-03-11 DIAGNOSIS — I483 Typical atrial flutter: Secondary | ICD-10-CM

## 2017-03-11 DIAGNOSIS — I10 Essential (primary) hypertension: Secondary | ICD-10-CM | POA: Diagnosis not present

## 2017-03-11 DIAGNOSIS — I428 Other cardiomyopathies: Secondary | ICD-10-CM | POA: Diagnosis not present

## 2017-03-11 MED ORDER — CARVEDILOL 25 MG PO TABS: 25.0000 mg | ORAL_TABLET | Freq: Two times a day (BID) | ORAL | 3 refills | Status: DC

## 2017-03-11 NOTE — Patient Instructions (Signed)
Medication Instructions:  INCREASE- Carvedilol 25 mg twice a day  If you need a refill on your cardiac medications before your next appointment, please call your pharmacy.  Labwork: None Ordered  Testing/Procedures: None Ordered  Follow-Up: Your physician wants you to follow-up in: 2 Months.    Thank you for choosing [facility] at [facility]!!

## 2017-03-15 DIAGNOSIS — E119 Type 2 diabetes mellitus without complications: Secondary | ICD-10-CM | POA: Diagnosis not present

## 2017-04-26 ENCOUNTER — Encounter: Payer: Self-pay | Admitting: Cardiology

## 2017-05-10 NOTE — Progress Notes (Signed)
Cardiology Office Note   Date:  05/12/2017   ID:  Alexandra Green, DOB [birth year], MRN [id number]  PCP:  [name], MD  Cardiologist:   Rollene Rotunda, MD    Chief Complaint  Patient presents with  . Atrial Flutter      History of Present Illness: Alexandra Green is a 77 y.o. female who presents for follow-up of atrial flutter and cardioversion.  The patient was hospitalized in Feb 2018 with shortness of breath and palpitations.  She underwent TEE cardioversion. She was treated with anticoagulation.  She did have a mildly reduced ejection fraction of 40-45% with diffuse hypokinesis. There was some moderate tricuspid regurgitation. She had mildly elevated pulmonary pressures.  We have seen her for med titration.  At the last visit I again increased her Coreg.  She returns for follow up.  Since I last saw her she has done well.  The patient denies any new symptoms such as chest discomfort, neck or arm discomfort. There has been no new shortness of breath, PND or orthopnea. There have been no reported palpitations, presyncope or syncope.  She gets around slowly with her walker.   Past Medical History:  Diagnosis Date  . Arthritis   . Diabetes mellitus without complication (HCC)   . Hypertension     Past Surgical History:  Procedure Laterality Date  . ABDOMINAL HYSTERECTOMY    . CARDIOVERSION N/A 05/17/2016   Procedure: CARDIOVERSION;  Surgeon: Jake Bathe, MD;  Location: [facility] ENDOSCOPY;  Service: Cardiovascular;  Laterality: N/A;  . CATARACT EXTRACTION    . CHOLECYSTECTOMY    . TEE WITHOUT CARDIOVERSION N/A 05/17/2016   Procedure: TRANSESOPHAGEAL ECHOCARDIOGRAM (TEE);  Surgeon: Jake Bathe, MD;  Location[facility] ENDOSCOPY;  Service: Cardiovascular;  Laterality: N/A;  . TOTAL KNEE ARTHROPLASTY Left      Current Outpatient Medications  Medication Sig Dispense Refill  . apixaban (ELIQUIS) 5 MG TABS tablet Take 1 tablet (5 mg total) by mouth 2 (two) times daily. 180 tablet 1    . carvedilol (COREG) 25 MG tablet Take 1 tablet (25 mg total) by mouth 2 (two) times daily with a meal. 180 tablet 3  . fluticasone (FLONASE) 50 MCG/ACT nasal spray Place 1 spray into both nostrils daily. 16 g 2  . glimepiride (AMARYL) 4 MG tablet Take 4 mg by mouth daily with breakfast.    . lisinopril-hydrochlorothiazide (PRINZIDE,ZESTORETIC) 20-25 MG tablet Take 1 tablet by mouth daily.    . metFORMIN (GLUCOPHAGE) 500 MG tablet Take 500 mg by mouth 2 (two) times daily with a meal.    . Multiple Vitamin (MULTIVITAMIN WITH MINERALS) TABS tablet Take 2 tablets by mouth daily.    . Omega-3 Fatty Acids (FISH OIL) 1000 MG CAPS Take 2 capsules by mouth daily.     No current facility-administered medications for this visit.     Allergies:   Patient has no known allergies.    ROS:  Please see the history of present illness.   Otherwise, review of systems are positive for none.   All other systems are reviewed and negative.    PHYSICAL EXAM: VS:  BP (!) 174/81   Pulse 69   Ht 5\' 6"  (1.676 m)   Wt 254 lb 3.2 oz (115.3 kg)   BMI 41.03 kg/m  , BMI Body mass index is 41.03 kg/m.  GEN:  No distress NECK:  No jugular venous distention at 90 degrees, waveform within normal limits, carotid upstroke brisk and symmetric, no  bruits, no thyromegaly LUNGS:  Clear to auscultation bilaterally BACK:  No CVA tenderness CHEST:  Unremarkable HEART:  S1 and S2 within normal limits, no S3, no S4, no clicks, no rubs, no murmurs ABD:  Positive bowel sounds normal in frequency in pitch, no bruits, no rebound, no guarding, unable to assess midline mass or bruit with the patient seated. EXT:  2 plus pulses throughout, moderate edema, no cyanosis no clubbing    EKG:  EKG not ordered today.   Recent Labs: 05/14/2016: TSH 1.711 05/17/2016: B Natriuretic Peptide 155.3 05/18/2016: ALT 21; BUN 19; Creatinine, Ser 0.81; Hemoglobin 12.0; Magnesium 1.8; Platelets 327; Potassium 4.0; Sodium 141    Lipid Panel No  results found for: CHOL, TRIG, HDL, CHOLHDL, VLDL, LDLCALC, LDLDIRECT    Wt Readings from Last 3 Encounters:  05/12/17 254 lb 3.2 oz (115.3 kg)  03/11/17 258 lb (117 kg)  01/19/17 261 lb (118.4 kg)      Other studies Reviewed: Additional studies/ records that were reviewed today include: None Review of the above records demonstrates:       ASSESSMENT AND PLAN:  ATRIAL FLUTTER: [name] has a CHA2DS2 - VASc score of 4 with a risk of stroke of 4%.  She is still in NSR by exam.  No change in therapy.  CARDIOMYOPATHY:  She is on a good therapy and has no symptoms.  No change.   HTN:  The blood pressure is at target. No change in medications is indicated. We will continue with therapeutic lifestyle changes (TLC).  Current medicines are reviewed at length with the patient today.  The patient does not have concerns regarding medicines.   The following changes have been made:  None  Labs/ tests ordered today include: None   No orders of the defined types were placed in this encounter.    Disposition:   FU with me in 6 months.    Signed, Rollene Rotunda, MD  05/12/2017 9:41 AM    [facility]

## 2017-05-12 ENCOUNTER — Ambulatory Visit: Payer: Medicare Other | Admitting: Cardiology

## 2017-05-12 ENCOUNTER — Encounter: Payer: Self-pay | Admitting: Cardiology

## 2017-05-12 VITALS — BP 174/81 | HR 69 | Ht 66.0 in | Wt 254.2 lb

## 2017-05-12 DIAGNOSIS — I10 Essential (primary) hypertension: Secondary | ICD-10-CM

## 2017-05-12 DIAGNOSIS — I483 Typical atrial flutter: Secondary | ICD-10-CM

## 2017-05-12 DIAGNOSIS — I428 Other cardiomyopathies: Secondary | ICD-10-CM | POA: Diagnosis not present

## 2017-05-12 NOTE — Patient Instructions (Signed)

## 2017-06-11 ENCOUNTER — Other Ambulatory Visit: Payer: Self-pay | Admitting: Cardiology

## 2017-06-28 DIAGNOSIS — R000 Tachycardia, unspecified: Secondary | ICD-10-CM | POA: Diagnosis not present

## 2017-06-28 DIAGNOSIS — M130 Polyarthritis, unspecified: Secondary | ICD-10-CM | POA: Diagnosis not present

## 2017-06-28 DIAGNOSIS — E118 Type 2 diabetes mellitus with unspecified complications: Secondary | ICD-10-CM | POA: Diagnosis not present

## 2017-06-28 DIAGNOSIS — M1000 Idiopathic gout, unspecified site: Secondary | ICD-10-CM | POA: Diagnosis not present

## 2017-06-28 DIAGNOSIS — E069 Thyroiditis, unspecified: Secondary | ICD-10-CM | POA: Diagnosis not present

## 2017-07-05 ENCOUNTER — Other Ambulatory Visit (HOSPITAL_COMMUNITY): Payer: Self-pay | Admitting: Family Medicine

## 2017-07-05 ENCOUNTER — Ambulatory Visit (HOSPITAL_COMMUNITY)
Admission: RE | Admit: 2017-07-05 | Discharge: 2017-07-05 | Disposition: A | Discharge status: Home / Self Care | Payer: Medicare Other | Source: Ambulatory Visit | Attending: Family Medicine | Admitting: Family Medicine

## 2017-07-05 DIAGNOSIS — M7989 Other specified soft tissue disorders: Secondary | ICD-10-CM | POA: Diagnosis not present

## 2017-07-05 DIAGNOSIS — I82401 Acute embolism and thrombosis of unspecified deep veins of right lower extremity: Secondary | ICD-10-CM | POA: Diagnosis not present

## 2017-07-05 DIAGNOSIS — M79604 Pain in right leg: Secondary | ICD-10-CM

## 2017-07-05 DIAGNOSIS — M1000 Idiopathic gout, unspecified site: Secondary | ICD-10-CM | POA: Diagnosis not present

## 2017-07-05 NOTE — Progress Notes (Signed)
Right lower extremity venous duplex completed. There is no evidence of a DVT, superficial thrombosis, or Baker's cyst. Toma Deiters, RVS  07/05/2017, 12:38 PM

## 2017-11-13 NOTE — Progress Notes (Signed)
Cardiology Office Note   Date:  11/15/2017   ID:  Alexandra Green, DOB [birth year], MRN [id number]  PCP:  [name], MD  Cardiologist:   Rollene Rotunda, MD    Chief Complaint  Patient presents with  . Atrial Flutter  . Cardiomyopathy      History of Present Illness: Alexandra Green is a 77 y.o. female who presents for follow-up of atrial flutter and cardioversion.  The patient was hospitalized in Feb 2018 with shortness of breath and palpitations.  She underwent TEE cardioversion. She was treated with anticoagulation.  She did have a mildly reduced ejection fraction of 40-45% with diffuse hypokinesis. There was some moderate tricuspid regurgitation. She had mildly elevated pulmonary pressures.  We have seen her for med titration.  She returns for follow up.    Since I last saw him he has done well.  The patient denies any new symptoms such as chest discomfort, neck or arm discomfort. There has been no new shortness of breath, PND or orthopnea. There have been no reported palpitations, presyncope or syncope.  She does her chores and climbs stairs.     Past Medical History:  Diagnosis Date  . Arthritis   . Diabetes mellitus without complication (HCC)   . Hypertension     Past Surgical History:  Procedure Laterality Date  . ABDOMINAL HYSTERECTOMY    . CARDIOVERSION N/A 05/17/2016   Procedure: CARDIOVERSION;  Surgeon: Jake Bathe, MD;  Location: [facility] ENDOSCOPY;  Service: Cardiovascular;  Laterality: N/A;  . CATARACT EXTRACTION    . CHOLECYSTECTOMY    . TEE WITHOUT CARDIOVERSION N/A 05/17/2016   Procedure: TRANSESOPHAGEAL ECHOCARDIOGRAM (TEE);  Surgeon: Jake Bathe, MD;  Location: [facility] ENDOSCOPY;  Service: Cardiovascular;  Laterality: N/A;  . TOTAL KNEE ARTHROPLASTY Left      Current Outpatient Medications  Medication Sig Dispense Refill  . carvedilol (COREG) 25 MG tablet Take 1 tablet (25 mg total) by mouth 2 (two) times daily with a meal. 180 tablet 3  . ELIQUIS 5 MG  TABS tablet TAKE 1 TABLET BY MOUTH TWICE A DAY 180 tablet 1  . fluticasone (FLONASE) 50 MCG/ACT nasal spray Place 1 spray into both nostrils daily. 16 g 2  . glimepiride (AMARYL) 4 MG tablet Take 4 mg by mouth daily with breakfast.    . lisinopril-hydrochlorothiazide (PRINZIDE,ZESTORETIC) 20-25 MG tablet Take 1 tablet by mouth daily.    . metFORMIN (GLUCOPHAGE) 500 MG tablet Take 500 mg by mouth 2 (two) times daily with a meal.    . Multiple Vitamin (MULTIVITAMIN WITH MINERALS) TABS tablet Take 2 tablets by mouth daily.    . Omega-3 Fatty Acids (FISH OIL) 1000 MG CAPS Take 2 capsules by mouth daily.     No current facility-administered medications for this visit.     Allergies:   Patient has no known allergies.    ROS:  Please see the history of present illness.   Otherwise, review of systems are positive for joint problems..   All other systems are reviewed and negative.    PHYSICAL EXAM: VS:  BP 116/64   Pulse 68   Ht 5\' 4"  (1.626 m)   Wt 241 lb 3.2 oz (109.4 kg)   BMI 41.40 kg/m  , BMI Body mass index is 41.4 kg/m.  GEN:  No distress NECK:  No jugular venous distention at 90 degrees, waveform within normal limits, carotid upstroke brisk and symmetric, no bruits, no thyromegaly LUNGS:  Clear to auscultation bilaterally  BACK:  No CVA tenderness CHEST:  Unremarkable HEART:  S1 and S2 within normal limits, no S3, no S4, no clicks, no rubs, no murmurs ABD:  Positive bowel sounds normal in frequency in pitch, no bruits, no rebound, no guarding, unable to assess midline mass or bruit with the patient seated. EXT:  2 plus pulses throughout, moderate edema, no cyanosis no clubbing   EKG: No acute ST-T wave changes.  Normal sinus rhythm, left axis deviation, early transition lead V2, left atrial enlargement, no acute ST T wave changes.     Recent Labs: No results found for requested labs within last 8760 hours.    Lipid Panel No results found for: CHOL, TRIG, HDL, CHOLHDL, VLDL,  LDLCALC, LDLDIRECT    Wt Readings from Last 3 Encounters:  11/15/17 241 lb 3.2 oz (109.4 kg)  05/12/17 254 lb 3.2 oz (115.3 kg)  03/11/17 258 lb (117 kg)      Other studies Reviewed: Additional studies/ records that were reviewed today include: None Review of the above records demonstrates:    No   ASSESSMENT AND PLAN:  ATRIAL FLUTTER: Alexandra Green has a CHA2DS2 - VASc score of 4 with a risk of stroke of 4%.   Maintaining NSR.  No change in therapy.    CARDIOMYOPATHY:    No change in therapy.   HTN:  The blood pressure is treated in the context of treating the reduced EF.   Current medicines are reviewed at length with the patient today.  The patient does not have concerns regarding medicines.   The following changes have been made:  None  Labs/ tests ordered today include: None  Orders Placed This Encounter  Procedures  . EKG 12-Lead     Disposition:   FU with me Theodore Demark, PAC in six months.    Signed, Rollene Rotunda, MD  11/15/2017 5:32 PM    [facility]

## 2017-11-15 ENCOUNTER — Ambulatory Visit: Payer: Medicare Other | Admitting: Cardiology

## 2017-11-15 ENCOUNTER — Encounter: Payer: Self-pay | Admitting: Cardiology

## 2017-11-15 VITALS — BP 116/64 | HR 68 | Ht 64.0 in | Wt 241.2 lb

## 2017-11-15 DIAGNOSIS — I420 Dilated cardiomyopathy: Secondary | ICD-10-CM

## 2017-11-15 DIAGNOSIS — I483 Typical atrial flutter: Secondary | ICD-10-CM

## 2017-11-15 NOTE — Patient Instructions (Signed)
Medication Instructions:  °Your physician recommends that you continue on your current medications as directed. Please refer to the Current Medication list given to you today.  ° °Labwork: °NONE ° °Testing/Procedures: °NONE ° °Follow-Up: °Your physician wants you to follow-up in: 6 MONTHS  You will receive a reminder letter in the mail two months in advance. If you don't receive a letter, please call our office to schedule the follow-up appointment. ° °If you need a refill on your cardiac medications before your next appointment, please call your pharmacy. °

## 2017-11-17 ENCOUNTER — Other Ambulatory Visit: Payer: Self-pay

## 2017-11-17 DIAGNOSIS — K5900 Constipation, unspecified: Secondary | ICD-10-CM | POA: Diagnosis not present

## 2017-11-17 DIAGNOSIS — Z1211 Encounter for screening for malignant neoplasm of colon: Secondary | ICD-10-CM | POA: Diagnosis not present

## 2017-11-17 NOTE — Patient Outreach (Signed)
[facility]) Care Management  11/17/2017  [name] 1940-11-18 [id number]   Medication Adherence call to Mr. Alexandra Green left a message for patient to call back patient is due on Lisinopril/HCtz 20/25 and Glimepiride 4 mg. Alexandra Green is showing past due under [employer].  Lillia Abed CPhT Pharmacy Technician [facility] Management Direct Dial [phone]  Fax [phone] [email or web address]

## 2017-11-25 DIAGNOSIS — I10 Essential (primary) hypertension: Secondary | ICD-10-CM | POA: Diagnosis not present

## 2017-11-25 DIAGNOSIS — M1000 Idiopathic gout, unspecified site: Secondary | ICD-10-CM | POA: Diagnosis not present

## 2017-11-25 DIAGNOSIS — E1100 Type 2 diabetes mellitus with hyperosmolarity without nonketotic hyperglycemic-hyperosmolar coma (NKHHC): Secondary | ICD-10-CM | POA: Diagnosis not present

## 2017-11-25 DIAGNOSIS — I481 Persistent atrial fibrillation: Secondary | ICD-10-CM | POA: Diagnosis not present

## 2017-12-18 ENCOUNTER — Other Ambulatory Visit: Payer: Self-pay | Admitting: Cardiology

## 2017-12-20 NOTE — Telephone Encounter (Signed)
Recent labs [facility]

## 2018-01-24 DIAGNOSIS — Z0000 Encounter for general adult medical examination without abnormal findings: Secondary | ICD-10-CM | POA: Diagnosis not present

## 2018-02-13 DIAGNOSIS — Z1211 Encounter for screening for malignant neoplasm of colon: Secondary | ICD-10-CM | POA: Diagnosis not present

## 2018-02-13 DIAGNOSIS — Z1212 Encounter for screening for malignant neoplasm of rectum: Secondary | ICD-10-CM | POA: Diagnosis not present

## 2018-02-21 ENCOUNTER — Encounter (HOSPITAL_COMMUNITY): Payer: Self-pay | Admitting: Emergency Medicine

## 2018-02-21 ENCOUNTER — Emergency Department (HOSPITAL_COMMUNITY)
Admission: EM | Admit: 2018-02-21 | Discharge: 2018-02-21 | Disposition: A | Discharge status: Home / Self Care | Payer: Medicare Other | Attending: Emergency Medicine | Admitting: Emergency Medicine

## 2018-02-21 ENCOUNTER — Emergency Department (HOSPITAL_COMMUNITY): Payer: Medicare Other

## 2018-02-21 DIAGNOSIS — I110 Hypertensive heart disease with heart failure: Secondary | ICD-10-CM | POA: Diagnosis not present

## 2018-02-21 DIAGNOSIS — I5043 Acute on chronic combined systolic (congestive) and diastolic (congestive) heart failure: Secondary | ICD-10-CM | POA: Diagnosis not present

## 2018-02-21 DIAGNOSIS — R9431 Abnormal electrocardiogram [ECG] [EKG]: Secondary | ICD-10-CM | POA: Diagnosis not present

## 2018-02-21 DIAGNOSIS — Z7984 Long term (current) use of oral hypoglycemic drugs: Secondary | ICD-10-CM | POA: Insufficient documentation

## 2018-02-21 DIAGNOSIS — Z79899 Other long term (current) drug therapy: Secondary | ICD-10-CM | POA: Insufficient documentation

## 2018-02-21 DIAGNOSIS — J069 Acute upper respiratory infection, unspecified: Secondary | ICD-10-CM | POA: Diagnosis not present

## 2018-02-21 DIAGNOSIS — E119 Type 2 diabetes mellitus without complications: Secondary | ICD-10-CM | POA: Diagnosis not present

## 2018-02-21 DIAGNOSIS — R0602 Shortness of breath: Secondary | ICD-10-CM | POA: Diagnosis not present

## 2018-02-21 HISTORY — DX: Heart failure, unspecified: I50.9

## 2018-02-21 LAB — BASIC METABOLIC PANEL
Anion gap: 8 (ref 5–15)
BUN: 17 mg/dL (ref 8–23)
CO2: 30 mmol/L (ref 22–32)
Calcium: 8.8 mg/dL — ABNORMAL LOW (ref 8.9–10.3)
Chloride: 101 mmol/L (ref 98–111)
Creatinine, Ser: 0.93 mg/dL (ref 0.44–1.00)
GFR calc Af Amer: >60 mL/min (ref >60)
GFR calc non Af Amer: 58 mL/min — ABNORMAL LOW (ref >60)
Glucose, Bld: 118 mg/dL — ABNORMAL HIGH (ref 70–99)
Potassium: 4 mmol/L (ref 3.5–5.1)
Sodium: 139 mmol/L (ref 135–145)

## 2018-02-21 LAB — CBC
HCT: 42.9 % (ref 36.0–46.0)
Hemoglobin: 13.6 g/dL (ref 12.0–15.0)
MCH: 29.3 pg (ref 26.0–34.0)
MCHC: 31.7 g/dL (ref 30.0–36.0)
MCV: 92.5 fL (ref 80.0–100.0)
Platelets: 296 10*3/uL (ref 150–400)
RBC: 4.64 MIL/uL (ref 3.87–5.11)
RDW: 14.6 % (ref 11.5–15.5)
WBC: 10.9 10*3/uL — ABNORMAL HIGH (ref 4.0–10.5)
nRBC: 0 % (ref 0.0–0.2)

## 2018-02-21 LAB — BRAIN NATRIURETIC PEPTIDE: B Natriuretic Peptide: 193.3 pg/mL — ABNORMAL HIGH (ref 0.0–100.0)

## 2018-02-21 MED ORDER — AEROCHAMBER PLUS FLO-VU LARGE MISC: 1.0000 | Freq: Once | Status: DC

## 2018-02-21 MED ORDER — ALBUTEROL SULFATE HFA 108 (90 BASE) MCG/ACT IN AERS
2.0000 | INHALATION_SPRAY | Freq: Once | RESPIRATORY_TRACT | Status: AC
  Administered 2018-02-21: 2 via RESPIRATORY_TRACT
  Filled 2018-02-21: qty 6.7

## 2018-02-21 MED ORDER — AEROCHAMBER PLUS FLO-VU MISC
1.0000 | Freq: Once | Status: DC
  Filled 2018-02-21: qty 1

## 2018-02-21 MED ORDER — AEROCHAMBER PLUS FLO-VU LARGE MISC
Status: AC
  Filled 2018-02-21: qty 1

## 2018-02-21 NOTE — ED Provider Notes (Signed)
[facility] EMERGENCY DEPARTMENT Provider Note   CSN: [id number] Arrival date & time: 02/21/18  1039     History   Chief Complaint Chief Complaint  Patient presents with  . Shortness of Breath    HPI Alexandra Green is a 77 y.o. female.  77 year old female presents from home with complaint of cough (productive, clear sputum) with shortness of breath.  Patient states that her symptoms started Sunday night, states that she was awake all night coughing and states that when she coughs she feels short of breath.  She denies fevers, chills, sweats, chest pain.  Patient shortness of breath is not worse with exertion.  Patient has a history of congestive heart failure and is followed closely through [facility] [facility], she denies any changes in her weight or swelling, states this does not feel similar to previous CHF episodes.  Patient states that she has pressure in her left ear as well as a runny nose and left side sore throat.  She denies any other complaints or concerns.  Patient has not taken her home medications today including her blood pressure medication. Patient is on Eliquis for a-fib.     Past Medical History:  Diagnosis Date  . Arthritis   . CHF (congestive heart failure) (HCC)   . Diabetes mellitus without complication (HCC)   . Hypertension     Patient Active Problem List   Diagnosis Date Noted  . Hypertension   . Arthritis   . Non-ischemic cardiomyopathy (HCC) 08/26/2016  . Acute on chronic combined systolic and diastolic CHF (congestive heart failure) (HCC)   . New onset atrial fibrillation (HCC) 05/15/2016  . Leukocytosis 05/15/2016  . Essential hypertension   . Diabetes mellitus without complication (HCC)   . Atrial fibrillation with RVR (HCC) 05/14/2016  . New onset atrial flutter (HCC) 05/14/2016    Past Surgical History:  Procedure Laterality Date  . ABDOMINAL HYSTERECTOMY    . CARDIOVERSION N/A 05/17/2016   Procedure: CARDIOVERSION;   Surgeon: Jake Bathe, MD;  Location: [facility] ENDOSCOPY;  Service: Cardiovascular;  Laterality: N/A;  . CATARACT EXTRACTION    . CHOLECYSTECTOMY    . TEE WITHOUT CARDIOVERSION N/A 05/17/2016   Procedure: TRANSESOPHAGEAL ECHOCARDIOGRAM (TEE);  Surgeon: Jake Bathe, MD;  Location: [facility] ENDOSCOPY;  Service: Cardiovascular;  Laterality: N/A;  . TOTAL KNEE ARTHROPLASTY Left      OB History   None      Home Medications    Prior to Admission medications   Medication Sig Start Date End Date Taking? Authorizing Provider  carvedilol (COREG) 25 MG tablet Take 1 tablet (25 mg total) by mouth 2 (two) times daily with a meal. 03/11/17  Yes Hochrein, James, MD  ELIQUIS 5 MG TABS tablet TAKE 1 TABLET BY MOUTH TWICE A DAY Patient taking differently: Take 5 mg by mouth 2 (two) times daily.  12/20/17  Yes Rollene Rotunda, MD  fluticasone (FLONASE) 50 MCG/ACT nasal spray Place 1 spray into both nostrils daily. 05/18/16  Yes Sheikh, Omair Latif, DO  glimepiride (AMARYL) 4 MG tablet Take 4 mg by mouth daily with breakfast.   Yes [provider]  lisinopril-hydrochlorothiazide (PRINZIDE,ZESTORETIC) 20-25 MG tablet Take 1 tablet by mouth daily.   Yes [provider]  metFORMIN (GLUCOPHAGE) 500 MG tablet Take 2,000 mg by mouth at bedtime.    Yes [provider]  Multiple Vitamin (MULTIVITAMIN WITH MINERALS) TABS tablet Take 1 tablet by mouth daily.    Yes [provider]  Omega-3  Fatty Acids (FISH OIL) 1000 MG CAPS Take 2 capsules by mouth daily.   Yes [provider]    Family History Family History  Problem Relation Age of Onset  . Heart failure Mother 68  . Cirrhosis Father   . Heart attack Son 45  . Atrial fibrillation Neg Hx     Social History Social History   Tobacco Use  . Smoking status: Never Smoker  . Smokeless tobacco: Never Used  Substance Use Topics  . Alcohol use: No  . Drug use: No     Allergies   Patient has no known allergies.   Review  of Systems Review of Systems  Constitutional: Negative for chills, diaphoresis and fever.  HENT: Positive for congestion, ear pain, postnasal drip, rhinorrhea and sore throat. Negative for ear discharge, sinus pressure, sinus pain and sneezing.   Respiratory: Positive for cough and shortness of breath. Negative for chest tightness and wheezing.   Cardiovascular: Negative for chest pain, palpitations and leg swelling.  Gastrointestinal: Negative for abdominal pain.  Musculoskeletal: Negative for arthralgias and myalgias.  Skin: Negative for rash and wound.  Allergic/Immunologic: Negative for immunocompromised state.  Neurological: Negative for dizziness and weakness.  Hematological: Bruises/bleeds easily.  All other systems reviewed and are negative.    Physical Exam Updated Vital Signs BP (!) 144/83   Pulse 71   Temp 97.9 F (36.6 C) (Oral)   Resp 19   Ht 5\' 4"  (1.626 m)   Wt 110.1 kg   SpO2 92%   BMI 41.68 kg/m   Physical Exam  Constitutional: She is oriented to person, place, and time. She appears well-developed and well-nourished. No distress.  HENT:  Head: Normocephalic and atraumatic.  Right Ear: Ear canal normal. Tympanic membrane is not erythematous, not retracted and not bulging. No middle ear effusion.  Left Ear: Ear canal normal. Tympanic membrane is not erythematous, not retracted and not bulging. A middle ear effusion is present.  Nose: Mucosal edema and rhinorrhea present. Right sinus exhibits no maxillary sinus tenderness and no frontal sinus tenderness. Left sinus exhibits no maxillary sinus tenderness and no frontal sinus tenderness.  Mouth/Throat: Uvula is midline and mucous membranes are normal. No trismus in the jaw. No uvula swelling. Posterior oropharyngeal erythema present. No posterior oropharyngeal edema or tonsillar abscesses.  Neck: Neck supple.  Cardiovascular: Normal rate, regular rhythm, normal heart sounds and intact distal pulses.    Pulmonary/Chest: Effort normal and breath sounds normal. No tachypnea. No respiratory distress.  Abdominal: Soft. There is no tenderness.  Musculoskeletal:       Right lower leg: Normal. She exhibits no edema.       Left lower leg: Normal. She exhibits no edema.  Lymphadenopathy:    She has no cervical adenopathy.  Neurological: She is alert and oriented to person, place, and time.  Skin: Skin is warm and dry. She is not diaphoretic.  Psychiatric: She has a normal mood and affect. Her behavior is normal.  Nursing note and vitals reviewed.    ED Treatments / Results  Labs (all labs ordered are listed, but only abnormal results are displayed) Labs Reviewed  BASIC METABOLIC PANEL - Abnormal; Notable for the following components:      Result Value   Glucose, Bld 118 (*)    Calcium 8.8 (*)    GFR calc non Af Amer 58 (*)    All other components within normal limits  CBC - Abnormal; Notable for the following components:   WBC 10.9 (*)  All other components within normal limits  BRAIN NATRIURETIC PEPTIDE - Abnormal; Notable for the following components:   B Natriuretic Peptide 193.3 (*)    All other components within normal limits    EKG EKG Interpretation  Date/Time:  Tuesday February 21 2018 10:51:00 EST Ventricular Rate:  67 PR Interval:    QRS Duration: 113 QT Interval:  390 QTC Calculation: 412 R Axis:   -29 Text Interpretation:  Sinus rhythm Probable left atrial enlargement Abnormal R-wave progression, early transition Left ventricular hypertrophy Confirmed by Geoffery Lyons ([id number]) on 02/21/2018 11:58:40 AM   Radiology Dg Chest 2 View  Result Date: 02/21/2018 CLINICAL DATA:  Shortness of breath EXAM: CHEST - 2 VIEW COMPARISON:  05/14/2016 FINDINGS: Elevation of the right hemidiaphragm. Heart is normal size. No confluent opacities or effusions. No acute bony abnormality. Degenerative changes in the shoulders and thoracic spine, stable. IMPRESSION: Stable chronic  elevation of the right hemidiaphragm. No active disease. Electronically Signed   By: Charlett Nose M.D.   On: 02/21/2018 11:44    Procedures Procedures (including critical care time)  Medications Ordered in ED Medications  AEROCHAMBER PLUS FLO-VU LARGE MISC 1 each (has no administration in time range)  AEROCHAMBER PLUS FLO-VU LARGE MISC (has no administration in time range)  albuterol (PROVENTIL HFA;VENTOLIN HFA) 108 (90 Base) MCG/ACT inhaler 2 puff (2 puffs Inhalation Given 02/21/18 1323)     Initial Impression / Assessment and Plan / ED Course  I have reviewed the triage vital signs and the nursing notes.  Pertinent labs & imaging results that were available during my care of the patient were reviewed by me and considered in my medical decision making (see chart for details).  Clinical Course as of Feb 22 1508  Tue Feb 21, 2018  [id number] 77 year old female presents with complaint of cough productive of clear sputum, congestion, sore throat, ear pressure.  Chest x-ray is unremarkable today, BNP is mildly elevated however patient has history of congestive heart failure and states this does not feel similar, no weight gain, no leg swelling.  Her CBC and BMP are without significant changes.  Patient feels like she just has a cold but is unable to cough up all the mucus and feels occasionally short of breath with coughing.  Recommend patient take plain Mucinex, given albuterol inhaler while in the ER.  Advised to follow-up with your PCP this week, return to ER for worsening or concerning symptoms.    [LM]    Clinical Course User Index [LM] Jeannie Fend, PA-C   Final Clinical Impressions(s) / ED Diagnoses   Final diagnoses:  Upper respiratory tract infection, unspecified type    ED Discharge Orders    None       Jeannie Fend, PA-C 02/21/18 1509    Geoffery Lyons, MD 02/21/18 1535

## 2018-02-21 NOTE — ED Notes (Signed)
Pt taught how to use inhaler and spacer. Pt verbalized understanding of discharge paperwork

## 2018-02-21 NOTE — Discharge Instructions (Addendum)
Take Mucinex as directed. Use inhaler as needed for cough. Follow up with your primary care provider for recheck later this week, return to the ER for any worsening or concerning symptoms.

## 2018-02-21 NOTE — ED Triage Notes (Signed)
Pt arrives with complaints of trouble catching her breath since yesterday. Reports a clear productive cough and sore throat. Pt hx of CHF and reports she is at her normal weight, denies any extra swelling.

## 2018-03-28 DIAGNOSIS — I10 Essential (primary) hypertension: Secondary | ICD-10-CM | POA: Diagnosis not present

## 2018-03-28 DIAGNOSIS — E1100 Type 2 diabetes mellitus with hyperosmolarity without nonketotic hyperglycemic-hyperosmolar coma (NKHHC): Secondary | ICD-10-CM | POA: Diagnosis not present

## 2018-03-28 DIAGNOSIS — M130 Polyarthritis, unspecified: Secondary | ICD-10-CM | POA: Diagnosis not present

## 2018-03-28 DIAGNOSIS — L0292 Furuncle, unspecified: Secondary | ICD-10-CM | POA: Diagnosis not present

## 2018-04-03 ENCOUNTER — Other Ambulatory Visit: Payer: Self-pay | Admitting: Cardiology

## 2018-04-20 DIAGNOSIS — L0292 Furuncle, unspecified: Secondary | ICD-10-CM | POA: Diagnosis not present

## 2018-04-20 DIAGNOSIS — I10 Essential (primary) hypertension: Secondary | ICD-10-CM | POA: Diagnosis not present

## 2018-05-16 ENCOUNTER — Ambulatory Visit: Payer: Medicare Other | Admitting: Physician Assistant

## 2018-05-29 NOTE — Progress Notes (Signed)
Cardiology Office Note   Date:  05/31/2018   ID:  KEA TEMKIN, DOB 1941/03/26, MRN [id number]  PCP:  [name], MD  Cardiologist:   Rollene Rotunda, MD    Chief Complaint  Patient presents with  . Cardiomyopathy      History of Present Illness: Alexandra Green is a 78 y.o. female who presents for follow-up of atrial flutter after cardioversion.  The patient was hospitalized in Feb 2018 with shortness of breath and palpitations.  She underwent TEE cardioversion. She was treated with anticoagulation.  She did have a mildly reduced ejection fraction of 40-45% with diffuse hypokinesis. There was some moderate tricuspid regurgitation. She had mildly elevated pulmonary pressures.  We have seen her for med titration.  She returns for follow up.    Since I last saw her she had a new granddaughter.  She is doing okay.  She does have a chronic cough productive of some mild white sputum.  However, she is had no new shortness of breath, PND or orthopnea.  She is had no chest pressure, neck or arm discomfort.  She gets around with her walker.  She is lost a little bit of weight.  She is watching her salt and fluid.  Her swelling has been mild.   Past Medical History:  Diagnosis Date  . Arthritis   . CHF (congestive heart failure) (HCC)   . Diabetes mellitus without complication (HCC)   . Hypertension     Past Surgical History:  Procedure Laterality Date  . ABDOMINAL HYSTERECTOMY    . CARDIOVERSION N/A 05/17/2016   Procedure: CARDIOVERSION;  Surgeon: Jake Bathe, MD;  Location: [facility] ENDOSCOPY;  Service: Cardiovascular;  Laterality: N/A;  . CATARACT EXTRACTION    . CHOLECYSTECTOMY    . TEE WITHOUT CARDIOVERSION N/A 05/17/2016   Procedure: TRANSESOPHAGEAL ECHOCARDIOGRAM (TEE);  Surgeon: Jake Bathe, MD;  Location: [facility] ENDOSCOPY;  Service: Cardiovascular;  Laterality: N/A;  . TOTAL KNEE ARTHROPLASTY Left      Current Outpatient Medications  Medication Sig Dispense Refill  .  carvedilol (COREG) 25 MG tablet TAKE 1 TABLET BY MOUTH TWICE A DAY WITH A MEAL. 180 tablet 1  . ELIQUIS 5 MG TABS tablet TAKE 1 TABLET BY MOUTH TWICE A DAY (Patient taking differently: Take 5 mg by mouth 2 (two) times daily. ) 180 tablet 1  . fluticasone (FLONASE) 50 MCG/ACT nasal spray Place 1 spray into both nostrils daily. 16 g 2  . glimepiride (AMARYL) 4 MG tablet Take 4 mg by mouth daily with breakfast.    . lisinopril-hydrochlorothiazide (PRINZIDE,ZESTORETIC) 20-25 MG tablet Take 1 tablet by mouth daily.    . metFORMIN (GLUCOPHAGE) 500 MG tablet Take 2,000 mg by mouth at bedtime.     . Multiple Vitamin (MULTIVITAMIN WITH MINERALS) TABS tablet Take 1 tablet by mouth daily.     . Omega-3 Fatty Acids (FISH OIL) 1000 MG CAPS Take 2 capsules by mouth daily.     No current facility-administered medications for this visit.     Allergies:   Patient has no known allergies.    ROS:  Please see the history of present illness.   Otherwise, review of systems are positive for none   All other systems are reviewed and negative.    PHYSICAL EXAM: VS:  BP (!) 150/73   Pulse 69   Ht 5\' 4"  (1.626 m)   Wt 242 lb (109.8 kg)   BMI 41.54 kg/m  , BMI Body mass index  is 41.54 kg/m.  PHYSICAL EXAM GEN:  No distress NECK:  No jugular venous distention at 90 degrees, waveform within normal limits, carotid upstroke brisk and symmetric, no bruits, no thyromegaly LYMPHATICS:  No cervical adenopathy LUNGS:  Clear to auscultation bilaterally BACK:  No CVA tenderness CHEST:  Unremarkable HEART:  S1 and S2 within normal limits, no S3, no S4, no clicks, no rubs, no murmurs ABD:  Positive bowel sounds normal in frequency in pitch, no bruits, no rebound, no guarding, unable to assess midline mass or bruit with the patient seated. EXT:  2 plus pulses throughout, mild right greater than left edema, no cyanosis no clubbing   EKG:    NA   Recent Labs: 02/21/2018: B Natriuretic Peptide 193.3; BUN 17;  Creatinine, Ser 0.93; Hemoglobin 13.6; Platelets 296; Potassium 4.0; Sodium 139    Lipid Panel No results found for: CHOL, TRIG, HDL, CHOLHDL, VLDL, LDLCALC, LDLDIRECT    Wt Readings from Last 3 Encounters:  05/31/18 242 lb (109.8 kg)  02/21/18 242 lb 12.8 oz (110.1 kg)  11/15/17 241 lb 3.2 oz (109.4 kg)      Other studies Reviewed: Additional studies/ records that were reviewed today include: Labs Review of the above records demonstrates:    See elsewhere   ASSESSMENT AND PLAN:  ATRIAL FLUTTER: Ms. KHADIJATOU WINTERS has a CHA2DS2 - VASc score of 4 with a risk of stroke of 4%.  She seems to be maintaining sinus rhythm.  No change in therapy.   CARDIOMYOPATHY:     Because she does have a mild cough and then to switch her to Cozaar 50/12.5.   HTN:  The blood pressure is slightly elevated.  However, this might be controlled with the change in medicines as above.   Current medicines are reviewed at length with the patient today.  The patient does not have concerns regarding medicines.   The following changes have been made:  As above  Labs/ tests ordered today include: None  No orders of the defined types were placed in this encounter.    Disposition:   FU with Rhonda in 6 months.    Signed, Rollene Rotunda, MD  05/31/2018 11:20 AM    [facility]

## 2018-05-31 ENCOUNTER — Encounter: Payer: Self-pay | Admitting: Cardiology

## 2018-05-31 ENCOUNTER — Ambulatory Visit: Payer: Medicare Other | Admitting: Cardiology

## 2018-05-31 VITALS — BP 150/73 | HR 69 | Ht 64.0 in | Wt 242.0 lb

## 2018-05-31 DIAGNOSIS — R05 Cough: Secondary | ICD-10-CM

## 2018-05-31 DIAGNOSIS — R059 Cough, unspecified: Secondary | ICD-10-CM | POA: Insufficient documentation

## 2018-05-31 DIAGNOSIS — I429 Cardiomyopathy, unspecified: Secondary | ICD-10-CM

## 2018-05-31 DIAGNOSIS — I10 Essential (primary) hypertension: Secondary | ICD-10-CM | POA: Diagnosis not present

## 2018-05-31 HISTORY — DX: Cough, unspecified: R05.9

## 2018-05-31 MED ORDER — LOSARTAN POTASSIUM-HCTZ 50-12.5 MG PO TABS: 1.0000 | ORAL_TABLET | Freq: Every day | ORAL | 3 refills | Status: DC

## 2018-05-31 NOTE — Patient Instructions (Addendum)
Medication Instructions:  STOP- Lisinopril- HCTz START- Losartan-HCTZ 50/12.5 mg daily  If you need a refill on your cardiac medications before your next appointment, please call your pharmacy.  Labwork: None ordered   Testing/Procedures: None Ordered  Follow-Up: You will need a follow up appointment in 6 months.  Please call our office 2 months in advance to schedule this appointment.  You may see one of the following Advanced Practice Providers on your designated Care Team:   Theodore Demark, PA-C  At [facility], you and your health needs are our priority.  As part of our continuing mission to provide you with exceptional heart care, we have created designated Provider Care Teams.  These Care Teams include your primary Cardiologist (physician) and Advanced Practice Providers (APPs -  Physician Assistants and Nurse Practitioners) who all work together to provide you with the care you need, when you need it.  Thank you for choosing [facility] at [facility]!!

## 2018-05-31 NOTE — Addendum Note (Signed)
Addended by: Barrie Dunker on: 05/31/2018 11:59 AM   Modules accepted: Orders

## 2018-06-15 DIAGNOSIS — E119 Type 2 diabetes mellitus without complications: Secondary | ICD-10-CM | POA: Diagnosis not present

## 2018-06-17 ENCOUNTER — Other Ambulatory Visit: Payer: Self-pay | Admitting: Cardiology

## 2018-08-18 ENCOUNTER — Other Ambulatory Visit: Payer: Self-pay | Admitting: Family Medicine

## 2018-08-18 DIAGNOSIS — M130 Polyarthritis, unspecified: Secondary | ICD-10-CM | POA: Diagnosis not present

## 2018-08-18 DIAGNOSIS — E1100 Type 2 diabetes mellitus with hyperosmolarity without nonketotic hyperglycemic-hyperosmolar coma (NKHHC): Secondary | ICD-10-CM | POA: Diagnosis not present

## 2018-08-18 DIAGNOSIS — R1311 Dysphagia, oral phase: Secondary | ICD-10-CM | POA: Diagnosis not present

## 2018-08-18 DIAGNOSIS — R1310 Dysphagia, unspecified: Secondary | ICD-10-CM

## 2018-08-18 DIAGNOSIS — I480 Paroxysmal atrial fibrillation: Secondary | ICD-10-CM | POA: Diagnosis not present

## 2018-08-22 ENCOUNTER — Other Ambulatory Visit: Payer: Self-pay | Admitting: Family Medicine

## 2018-08-22 ENCOUNTER — Ambulatory Visit
Admission: RE | Admit: 2018-08-22 | Discharge: 2018-08-22 | Disposition: A | Discharge status: Home / Self Care | Payer: Medicare Other | Source: Ambulatory Visit | Attending: Family Medicine | Admitting: Family Medicine

## 2018-08-22 DIAGNOSIS — R1310 Dysphagia, unspecified: Secondary | ICD-10-CM | POA: Diagnosis not present

## 2018-09-22 DIAGNOSIS — R1311 Dysphagia, oral phase: Secondary | ICD-10-CM | POA: Diagnosis not present

## 2018-09-22 DIAGNOSIS — Z0000 Encounter for general adult medical examination without abnormal findings: Secondary | ICD-10-CM | POA: Diagnosis not present

## 2018-11-10 ENCOUNTER — Telehealth: Payer: Self-pay

## 2018-11-10 NOTE — Telephone Encounter (Signed)
YOUR CARDIOLOGY TEAM HAS ARRANGED FOR AN E-VISIT FOR YOUR APPOINTMENT - PLEASE REVIEW IMPORTANT INFORMATION BELOW SEVERAL DAYS PRIOR TO YOUR APPOINTMENT ° °Due to the recent COVID-19 pandemic, we are transitioning in-person office visits to tele-medicine visits in an effort to decrease unnecessary exposure to our patients, their families, and staff. These visits are billed to your insurance just like a normal visit is. We also encourage you to sign up for MyChart if you have not already done so. You will need a smartphone if possible. For patients that do not have this, we can still complete the visit using a regular telephone but do prefer a smartphone to enable video when possible. You may have a family member that lives with you that can help. If possible, we also ask that you have a blood pressure cuff and scale at home to measure your blood pressure, heart rate and weight prior to your scheduled appointment. Patients with clinical needs that need an in-person evaluation and testing will still be able to come to the office if absolutely necessary. If you have any questions, feel free to call our office. ° °• IF USING MYCHART - How to Download the MyChart App to Your SmartPhone  ° °- If Apple, go to App Store and type in MyChart in the search bar and download the app. If Android, ask patient to go to Google Play Store and type in MyChart in the search bar and download the app. The app is free but as with any other app downloads, your phone may require you to verify saved payment information or Apple/Android password.  °- You will need to then log into the app with your MyChart username and password, and select [name] as your healthcare provider to link the account.  °- When it is time for your visit, go to the MyChart app, find appointments, and click Begin Video Visit. Be sure to Select Allow for your device to access the Microphone and Camera for your visit. You will then be connected, and your provider  will be with you shortly. ° **If you have any issues connecting or need assistance, please contact MyChart service desk [phone] ([phone])** ° **If using a computer, in order to ensure the best quality for your visit, you will need to use either of the following Internet Browsers: Google Chrome or Microsoft Edge** ° °• IF USING DOXIMITY or [email or web address].ME - The staff will give you instructions on receiving your link to join the meeting the day of your visit.  ° ° ° ° °2-3 DAYS BEFORE YOUR APPOINTMENT ° °You will receive a telephone call from one of our [facility] team members - your caller ID may say "Unknown caller." If this is a video visit, we will walk you through how to get the video launched on your phone. We will remind you check your blood pressure, heart rate and weight prior to your scheduled appointment. If you have an Apple Watch or Kardia, please upload any pertinent ECG strips the day before or morning of your appointment to MyChart. Our staff will also make sure you have reviewed the consent and agree to move forward with your scheduled tele-health visit.  ° ° ° °THE DAY OF YOUR APPOINTMENT ° °Approximately 15 minutes prior to your scheduled appointment, you will receive a telephone call from one of HeartCare team - your caller ID may say "Unknown caller."  Our staff will confirm medications, vital signs for the day and any symptoms you may be experiencing.   Please have this information available prior to the time of visit start. It may also be helpful for you to have a pad of paper and pen handy for any instructions given during your visit. They will also walk you through joining the smartphone meeting if this is a video visit. ° ° ° °CONSENT FOR TELE-HEALTH VISIT - PLEASE REVIEW ° °I hereby voluntarily request, consent and authorize [facility] and its employed or contracted physicians, physician assistants, nurse practitioners or other licensed health care professionals (the Practitioner), to  provide me with telemedicine health care services (the “Services") as deemed necessary by the treating Practitioner. I acknowledge and consent to receive the Services by the Practitioner via telemedicine. I understand that the telemedicine visit will involve communicating with the Practitioner through live audiovisual communication technology and the disclosure of certain medical information by electronic transmission. I acknowledge that I have been given the opportunity to request an in-person assessment or other available alternative prior to the telemedicine visit and am voluntarily participating in the telemedicine visit. ° °I understand that I have the right to withhold or withdraw my consent to the use of telemedicine in the course of my care at any time, without affecting my right to future care or treatment, and that the Practitioner or I may terminate the telemedicine visit at any time. I understand that I have the right to inspect all information obtained and/or recorded in the course of the telemedicine visit and may receive copies of available information for a reasonable fee.  I understand that some of the potential risks of receiving the Services via telemedicine include:  °• Delay or interruption in medical evaluation due to technological equipment failure or disruption; °• Information transmitted may not be sufficient (e.g. poor resolution of images) to allow for appropriate medical decision making by the Practitioner; and/or  °• In rare instances, security protocols could fail, causing a breach of personal health information. ° °Furthermore, I acknowledge that it is my responsibility to provide information about my medical history, conditions and care that is complete and accurate to the best of my ability. I acknowledge that Practitioner's advice, recommendations, and/or decision may be based on factors not within their control, such as incomplete or inaccurate data provided by me or distortions of  diagnostic images or specimens that may result from electronic transmissions. I understand that the practice of medicine is not an exact science and that Practitioner makes no warranties or guarantees regarding treatment outcomes. I acknowledge that I will receive a copy of this consent concurrently upon execution via email to the email address I last provided but may also request a printed copy by calling the office of [facility].   ° °I understand that my insurance will be billed for this visit.  ° °I have read or had this consent read to me. °• I understand the contents of this consent, which adequately explains the benefits and risks of the Services being provided via telemedicine.  °• I have been provided ample opportunity to ask questions regarding this consent and the Services and have had my questions answered to my satisfaction. °• I give my informed consent for the services to be provided through the use of telemedicine in my medical care ° °By participating in this telemedicine visit I agree to the above. ° °

## 2018-11-15 NOTE — Progress Notes (Signed)
Video Visit Patient has given verbal permission to conduct this visit via virtual appointment and to bill insurance 11/16/2018 10 am      Date:  11/16/2018   ID:  Alexandra BoardIantha L Henricksen, DOB 1941-01-24, MRN [id number]  Patient location: Home Provider location: Home office  PCP:  Renaye RakersBland, Veita, MD  Cardiologist:  Hochrein  Electrophysiologist:  None   Evaluation Performed: Follow-up  Chief Complaint: Hypertension, atrial fibrillation  History of Present Illness:    Alexandra Green is a 78 y.o. female for ongoing assessment and management of atrial flutter after DCCV. She had a TEE cardioversion and was treated with anticoagulation with Eliquis wit CHADS VASC score of 4. Other history of cardiomyopathy, hypertension, and CHF, with diabetes.   On last office visit with Dr. Antoine PocheHochrein dated 05/31/2018 the patient was doing well.  He noted that she had a chronic cough which was unchanged.  She was not having any shortness of breath.  Her lower extremity edema was mild.  She maintained sinus rhythm per EKG.  She was taken off of lisinopril HCTZ and placed on carvedilol, and losartan HCTZ.    Unfortunately, the patient has gotten confused on her medications.  She is taking both losartan HCTZ and lisinopril HCTZ and has not been taking carvedilol.  The patient denies dizziness, nausea, headache, blurred vision, or fatigue.  She is due to see her primary care physician, Dr. Parke SimmersBland, on November 22, 2018.  She states that she has no way of checking her blood pressure at home, but is keeping up with her blood sugar.  It is ranging around 120 in the morning before she takes her medication.  She is on metformin.  She denies any swelling in her legs or feet, she denies any dyspnea on exertion or PND.  The patient does not have symptoms concerning for COVID-19 infection (fever, chills, cough, or new shortness of breath).    Past Medical History:  Diagnosis Date  . Arthritis   . CHF (congestive heart failure) (HCC)    . Diabetes mellitus without complication (HCC)   . Hypertension    Past Surgical History:  Procedure Laterality Date  . ABDOMINAL HYSTERECTOMY    . CARDIOVERSION N/A 05/17/2016   Procedure: CARDIOVERSION;  Surgeon: Jake BatheMark C Skains, MD;  [facility];  Service: Cardiovascular;  Laterality: N/A;  . CATARACT EXTRACTION    . CHOLECYSTECTOMY    . TEE WITHOUT CARDIOVERSION N/A 05/17/2016   Procedure: TRANSESOPHAGEAL ECHOCARDIOGRAM (TEE);  Surgeon: Jake BatheMark C Skains, MD;  [facility];  Service: Cardiovascular;  Laterality: N/A;  . TOTAL KNEE ARTHROPLASTY Left      Current Meds  Medication Sig  . ELIQUIS 5 MG TABS tablet TAKE 1 TABLET BY MOUTH TWICE A DAY  . fluticasone (FLONASE) 50 MCG/ACT nasal spray Place 1 spray into both nostrils daily.  Marland Kitchen. glimepiride (AMARYL) 4 MG tablet Take 4 mg by mouth daily with breakfast.  . losartan-hydrochlorothiazide (HYZAAR) 50-12.5 MG tablet Take 1 tablet by mouth daily.  . metFORMIN (GLUCOPHAGE) 500 MG tablet Take 2,000 mg by mouth at bedtime.   . Multiple Vitamin (MULTIVITAMIN WITH MINERALS) TABS tablet Take 1 tablet by mouth daily.   . Omega-3 Fatty Acids (FISH OIL) 1000 MG CAPS Take 2 capsules by mouth daily.  . [DISCONTINUED] carvedilol (COREG) 25 MG tablet TAKE 1 TABLET BY MOUTH TWICE A DAY WITH A MEAL.     Allergies:   Patient has no known allergies.   Social History   Tobacco Use  .  Smoking status: Never Smoker  . Smokeless tobacco: Never Used  Substance Use Topics  . Alcohol use: No  . Drug use: No     Family Hx: The patient's family history includes Cirrhosis in her father; Heart attack (age of onset: 54) in her son; Heart failure (age of onset: 42) in her mother. There is no history of Atrial fibrillation.  ROS:   Please see the history of present illness.    All other systems reviewed and are negative.   Prior CV studies:   The following studies were reviewed today:  Echocardiogram 05/17/2016  Left ventricle: The  cavity size was normal. There was moderate   concentric hypertrophy. Systolic function was mildly to   moderately reduced. The estimated ejection fraction was in the   range of 40% to 45%. Diffuse hypokinesis. - Aortic valve: Trileaflet; normal thickness leaflets. There was no   regurgitation. - Mitral valve: Calcified annulus. Mildly thickened leaflets .   There was mild regurgitation. - Left atrium: The atrium was mildly dilated. - Right ventricle: Systolic function was normal. - Right atrium: The atrium was mildly dilated. - Tricuspid valve: There was moderate regurgitation. - Pulmonic valve: There was mild regurgitation. - Pulmonary arteries: Systolic pressure was mildly increased. PA   peak pressure: 40 mm Hg (S). - Inferior vena cava: The vessel was dilated. The respirophasic   diameter changes were blunted (< 50%), consistent with elevated   central venous pressure. - Pericardium, extracardiac: There was no pericardial effusion.   Labs/Other Tests and Data Reviewed:    EKG: Not completed as this is a virtual visit.  Recent Labs: 02/21/2018: B Natriuretic Peptide 193.3; BUN 17; Creatinine, Ser 0.93; Hemoglobin 13.6; Platelets 296; Potassium 4.0; Sodium 139   Recent Lipid Panel No results found for: CHOL, TRIG, HDL, CHOLHDL, LDLCALC, LDLDIRECT  Wt Readings from Last 3 Encounters:  11/16/18 232 lb (105.2 kg)  05/31/18 242 lb (109.8 kg)  02/21/18 242 lb 12.8 oz (110.1 kg)     Objective:    Vital Signs:  Ht 5\' 4"  (1.626 m)   Wt 232 lb (105.2 kg)   BMI 39.82 kg/m    General: Awake alert oriented no acute distress Respirations: Normal inspiratory expiratory effort, no coughing or wheezing Musculoskeletal: Normal strength and range of motion.  She does walk with a cane. Neuro: No focal deficits Psych, good affect, appropriate responses during conversation.  ASSESSMENT & PLAN:    1.  Hypertension: Concerns about taking both losartan and lisinopril, and not having  the ability to take her blood pressure at home.  She denies any symptoms associated with taking both medications with exception of frequent urination.  I have advised that she have a BMET today, but she refuses and wishes to have it completed when she sees her primary care physician, Dr. Criss Rosales, on November 22, 2018.  She is advised to stop taking lisinopril and begin carvedilol 25 mg twice daily as directed.  She will need to obtain a home blood pressure machine for home surveillance on blood pressure control.  I have asked her to not take the carvedilol for 48 hours allowing her blood pressure to equilibrate before starting beta-blocker.  She verbalizes understanding.  2.  Paroxysmal atrial flutter.  EKG was not completed today as this is a virtual visit.  She remains compliant with Eliquis and denies any bleeding or excessive bruising.  Will need follow-up EKG on next visit.  3.  Chronic diastolic and systolic CHF: Most recent echocardiogram  completed in February 2018 revealed LVEF of 45 to 50%.  She will need to have follow-up echocardiogram on next office visit for ongoing assessment of heart function.  4.  Diabetes: She is on metformin.  There is concern about her kidney status taking both ARB, ACE, and on metformin.  BMET is to be drawn by primary care as patient refuses to have it done today through our office as this is a virtual visit and she does not wish to come out in public at this time.  COVID-19 Education: The signs and symptoms of COVID-19 were discussed with the patient and how to seek care for testing (follow up with PCP or arrange E-visit).  The importance of social distancing was discussed today.  Time:   Today, I have spent 15  minutes with the patient with telehealth technology discussing the above problems.     Medication Adjustments/Labs and Tests Ordered: Current medicines are reviewed at length with the patient today.  Concerns regarding medicines are outlined above.    Tests Ordered: No orders of the defined types were placed in this encounter.   Medication Changes: Meds ordered this encounter  Medications  . carvedilol (COREG) 25 MG tablet    Sig: Take 1 tablet (25 mg total) by mouth 2 (two) times daily with a meal.    Dispense:  180 tablet    Refill:  3    Disposition:  Follow up 3 months-can be virtual  Signed, Bettey Mare. Liborio Nixon, ANP, AACC  11/16/2018 10:18 AM    [facility]

## 2018-11-16 ENCOUNTER — Other Ambulatory Visit: Payer: Self-pay

## 2018-11-16 ENCOUNTER — Encounter: Payer: Self-pay | Admitting: Adult Health

## 2018-11-16 ENCOUNTER — Telehealth (INDEPENDENT_AMBULATORY_CARE_PROVIDER_SITE_OTHER): Payer: Medicare Other | Admitting: Adult Health

## 2018-11-16 VITALS — Ht 64.0 in | Wt 232.0 lb

## 2018-11-16 DIAGNOSIS — I4892 Unspecified atrial flutter: Secondary | ICD-10-CM

## 2018-11-16 DIAGNOSIS — IMO0001 Reserved for inherently not codable concepts without codable children: Secondary | ICD-10-CM

## 2018-11-16 DIAGNOSIS — I5042 Chronic combined systolic (congestive) and diastolic (congestive) heart failure: Secondary | ICD-10-CM

## 2018-11-16 DIAGNOSIS — I10 Essential (primary) hypertension: Secondary | ICD-10-CM

## 2018-11-16 MED ORDER — CARVEDILOL 25 MG PO TABS: 25.0000 mg | ORAL_TABLET | Freq: Two times a day (BID) | ORAL | 3 refills | Status: DC

## 2018-11-16 NOTE — Patient Instructions (Signed)
Medication Instructions:  Continue current medications  If you need a refill on your cardiac medications before your next appointment, please call your pharmacy.  Labwork: None Ordered   Testing/Procedures: None Ordered  Follow-Up: You will need a follow up appointment in 3 months.  Please call our office 2 months in advance to schedule this appointment.  You may see Dr Hochrein or one of the following Advanced Practice Providers on your designated Care Team:   Rhonda Barrett, PA-C . Kathryn Lawrence, DNP, ANP     At [facility], you and your health needs are our priority.  As part of our continuing mission to provide you with exceptional heart care, we have created designated Provider Care Teams.  These Care Teams include your primary Cardiologist (physician) and Advanced Practice Providers (APPs -  Physician Assistants and Nurse Practitioners) who all work together to provide you with the care you need, when you need it.  Thank you for choosing [facility] at [facility]!!     

## 2018-11-22 DIAGNOSIS — I10 Essential (primary) hypertension: Secondary | ICD-10-CM | POA: Diagnosis not present

## 2018-11-22 DIAGNOSIS — I509 Heart failure, unspecified: Secondary | ICD-10-CM | POA: Diagnosis not present

## 2018-11-22 DIAGNOSIS — R7309 Other abnormal glucose: Secondary | ICD-10-CM | POA: Diagnosis not present

## 2018-11-22 DIAGNOSIS — E069 Thyroiditis, unspecified: Secondary | ICD-10-CM | POA: Diagnosis not present

## 2018-11-22 DIAGNOSIS — R1311 Dysphagia, oral phase: Secondary | ICD-10-CM | POA: Diagnosis not present

## 2018-11-22 DIAGNOSIS — I480 Paroxysmal atrial fibrillation: Secondary | ICD-10-CM | POA: Diagnosis not present

## 2018-11-22 DIAGNOSIS — I4891 Unspecified atrial fibrillation: Secondary | ICD-10-CM | POA: Diagnosis not present

## 2018-11-22 DIAGNOSIS — M130 Polyarthritis, unspecified: Secondary | ICD-10-CM | POA: Diagnosis not present

## 2018-11-29 DIAGNOSIS — E1169 Type 2 diabetes mellitus with other specified complication: Secondary | ICD-10-CM | POA: Diagnosis not present

## 2018-11-29 DIAGNOSIS — I509 Heart failure, unspecified: Secondary | ICD-10-CM | POA: Diagnosis not present

## 2018-12-12 ENCOUNTER — Other Ambulatory Visit: Payer: Self-pay | Admitting: Cardiology

## 2018-12-12 NOTE — Telephone Encounter (Signed)
36f 109.8kg Scr 0.93 02/21/18 Lovw/lawrence 11/16/18

## 2018-12-12 NOTE — Telephone Encounter (Signed)
Please review for refill. Thank you! 

## 2018-12-27 DIAGNOSIS — I509 Heart failure, unspecified: Secondary | ICD-10-CM | POA: Diagnosis not present

## 2019-01-10 DIAGNOSIS — I509 Heart failure, unspecified: Secondary | ICD-10-CM | POA: Diagnosis not present

## 2019-02-06 DIAGNOSIS — M1711 Unilateral primary osteoarthritis, right knee: Secondary | ICD-10-CM | POA: Diagnosis not present

## 2019-02-06 DIAGNOSIS — M25561 Pain in right knee: Secondary | ICD-10-CM | POA: Diagnosis not present

## 2019-02-09 DIAGNOSIS — M130 Polyarthritis, unspecified: Secondary | ICD-10-CM | POA: Diagnosis not present

## 2019-02-09 DIAGNOSIS — I10 Essential (primary) hypertension: Secondary | ICD-10-CM | POA: Diagnosis not present

## 2019-02-09 DIAGNOSIS — I480 Paroxysmal atrial fibrillation: Secondary | ICD-10-CM | POA: Diagnosis not present

## 2019-02-17 DIAGNOSIS — I5032 Chronic diastolic (congestive) heart failure: Secondary | ICD-10-CM | POA: Insufficient documentation

## 2019-02-17 HISTORY — DX: Chronic diastolic (congestive) heart failure: I50.32

## 2019-02-17 NOTE — Progress Notes (Signed)
Virtual Visit via Telephone Note   This visit type was conducted due to national recommendations for restrictions regarding the COVID-19 Pandemic (e.g. social distancing) in an effort to limit this patient's exposure and mitigate transmission in our community.  Due to her co-morbid illnesses, this patient is at least at moderate risk for complications without adequate follow up.  This format is felt to be most appropriate for this patient at this time.  The patient did not have access to video technology/had technical difficulties with video requiring transitioning to audio format only (telephone).  All issues noted in this document were discussed and addressed.  No physical exam could be performed with this format.  Please refer to the patient's chart for her  consent to telehealth for [facility].   Date:  02/19/2019   ID:  Alexandra Green, DOB 1940-08-29, MRN [id number]  Patient Location: Home Provider Location: Home  PCP:  [name], MD  Cardiologist:  [name], MD  Electrophysiologist:  None   Evaluation Performed:  Follow-Up Visit  Chief Complaint:  Atrial flutter  History of Present Illness:    Alexandra Green is a 78 y.o. female for follow up of atrial flutter after DCCV. She had a TEE cardioversion and was treated with anticoagulation with Eliquis with CHADS VASC score of 4. Other history of cardiomyopathy, hypertension, and CHF, with diabetes.   At the last visit it became clear that she was taking both her ACE inhibitor and ARB.    Today we tried to do a video visit but she could not activate her camera.  She can see me.  Return to a telephone visit.  She has been feeling okay.  She does have a higher heart rate.  Says he gets up to about 108 when she moves around or goes back down.  She cannot tell necessarily that it is irregular.  She is not having any presyncope or syncope.  Her blood pressure has been elevated.  She did have her losartan increased by her  primary provider.  Still she is getting blood pressures are well tolerated.  Not describing any new PND or orthopnea.  Not having any new chest pressure, neck or arm discomfort.  She had no weight gain or edema  The patient does not have symptoms concerning for COVID-19 infection (fever, chills, cough, or new shortness of breath).    Past Medical History:  Diagnosis Date  . Arthritis   . CHF (congestive heart failure) ([place])   . Diabetes mellitus without complication ([place])   . Hypertension    Past Surgical History:  Procedure Laterality Date  . ABDOMINAL HYSTERECTOMY    . CARDIOVERSION N/A 05/17/2016   Procedure: CARDIOVERSION;  Surgeon: [name], MD;  Location: [place];  Service: Cardiovascular;  Laterality: N/A;  . CATARACT EXTRACTION    . CHOLECYSTECTOMY    . TEE WITHOUT CARDIOVERSION N/A 05/17/2016   Procedure: TRANSESOPHAGEAL ECHOCARDIOGRAM (TEE);  Surgeon: [name], MD;  Location: [facility] ENDOSCOPY;  Service: Cardiovascular;  Laterality: N/A;  . TOTAL KNEE ARTHROPLASTY Left      Current Meds  Medication Sig  . carvedilol (COREG) 25 MG tablet Take 1 tablet (25 mg total) by mouth 2 (two) times daily with a meal.  . ELIQUIS 5 MG TABS tablet TAKE 1 TABLET BY MOUTH TWICE A DAY  . fluticasone (FLONASE) 50 MCG/ACT nasal spray Place 1 spray into both nostrils daily.  Marland Kitchen glimepiride (AMARYL) 4 MG tablet Take 4 mg by mouth  daily with breakfast.  . losartan-hydrochlorothiazide (HYZAAR) 50-12.5 MG tablet Take 1 tablet by mouth daily.  . metFORMIN (GLUCOPHAGE) 500 MG tablet Take 2,000 mg by mouth at bedtime.   . Multiple Vitamin (MULTIVITAMIN WITH MINERALS) TABS tablet Take 1 tablet by mouth daily.   . Omega-3 Fatty Acids (FISH OIL) 1000 MG CAPS Take 2 capsules by mouth daily.     Allergies:   Patient has no known allergies.   Social History   Tobacco Use  . Smoking status: Never Smoker  . Smokeless tobacco: Never Used  Substance Use Topics  . Alcohol use: No  . Drug use: No      Family Hx: The patient's family history includes Cirrhosis in her father; Heart attack (age of onset: 63) in her son; Heart failure (age of onset: 51) in her mother. There is no history of Atrial fibrillation.  ROS:   Please see the history of present illness.     All other systems reviewed and are negative.   Prior CV studies:   The following studies were reviewed today:    Labs/Other Tests and Data Reviewed:    EKG:  No ECG reviewed.  Recent Labs: 02/21/2018: B Natriuretic Peptide 193.3; BUN 17; Creatinine, Ser 0.93; Hemoglobin 13.6; Platelets 296; Potassium 4.0; Sodium 139   Recent Lipid Panel No results found for: CHOL, TRIG, HDL, CHOLHDL, LDLCALC, LDLDIRECT  Wt Readings from Last 3 Encounters:  02/19/19 232 lb 9.6 oz (105.5 kg)  11/16/18 232 lb (105.2 kg)  05/31/18 242 lb (109.8 kg)     Objective:    Vital Signs:  BP (!) 151/113 (BP Location: Right Arm)   Pulse (!) 110   Wt 232 lb 9.6 oz (105.5 kg)   BMI 39.93 kg/m    VITAL SIGNS:  reviewed  ASSESSMENT & PLAN:    Hypertension:  Today has been added hydralazine 10 mg twice daily.  She would be able to take this 3 times a day.  May be eventually be able to get her on diet deal but cost will be a consideration.  She is going to keep a blood pressure diary.  Paroxysmal atrial flutter.    She is likely having episodes of flutter.  Start on coagulation.  She is going to get a pulse oximeter to keep her heart rate recorded and I gave her goals of therapy and she will let me know if it is running higher routinely.  EKG was not completed today as this is a virtual visit.  She remains compliant with Eliquis and denies any bleeding or excessive bruising.  Will need follow-up EKG on next visit.  Chronic diastolic and systolic CHF:  Talking to her she does not seem to be volume overloaded.  See her back in the office in December to assess her volume.  She will continue with meds as listed, salt and fluid restriction.     COVID-19 Education: The signs and symptoms of COVID-19 were discussed with the patient and how to seek care for testing (follow up with PCP or arrange E-visit).  The importance of social distancing was discussed today.  Time:   Today, I have spent 17 minutes with the patient with telehealth technology discussing the above problems.   Hydralazine   Medication Adjustments/Labs and Tests Ordered: Current medicines are reviewed at length with the patient today.  Concerns regarding medicines are outlined above.   Tests Ordered: No orders of the defined types were placed in this encounter.   Medication Changes:  Meds ordered this encounter  Medications  . hydrALAZINE (APRESOLINE) 10 MG tablet    Sig: Take 1 tablet (10 mg total) by mouth 2 (two) times daily.    Dispense:  180 tablet    Refill:  3    Follow Up:  In Person in Dec  Signed, Rollene Rotunda, MD  02/19/2019 1:23 PM    [facility]

## 2019-02-19 ENCOUNTER — Telehealth (INDEPENDENT_AMBULATORY_CARE_PROVIDER_SITE_OTHER): Payer: Medicare Other | Admitting: Cardiology

## 2019-02-19 ENCOUNTER — Encounter: Payer: Self-pay | Admitting: Cardiology

## 2019-02-19 VITALS — BP 151/113 | HR 110 | Wt 232.6 lb

## 2019-02-19 DIAGNOSIS — I4892 Unspecified atrial flutter: Secondary | ICD-10-CM | POA: Diagnosis not present

## 2019-02-19 DIAGNOSIS — I5032 Chronic diastolic (congestive) heart failure: Secondary | ICD-10-CM

## 2019-02-19 DIAGNOSIS — I10 Essential (primary) hypertension: Secondary | ICD-10-CM

## 2019-02-19 MED ORDER — HYDRALAZINE HCL 10 MG PO TABS: 10.0000 mg | ORAL_TABLET | Freq: Two times a day (BID) | ORAL | 3 refills | Status: DC

## 2019-02-19 NOTE — Patient Instructions (Signed)
Medication Instructions:  START HYDRALAZINE 10 MG TWICE DAILY  *If you need a refill on your cardiac medications before your next appointment, please call your pharmacy*  Lab Work: If you have labs (blood work) drawn today and your tests are completely normal, you will receive your results only by: Marland Kitchen MyChart Message (if you have MyChart) OR . A paper copy in the mail If you have any lab test that is abnormal or we need to change your treatment, we will call you to review the results.  Follow-Up: At [facility], you and your health needs are our priority.  As part of our continuing mission to provide you with exceptional heart care, we have created designated Provider Care Teams.  These Care Teams include your primary Cardiologist (physician) and Advanced Practice Providers (APPs -  Physician Assistants and Nurse Practitioners) who all work together to provide you with the care you need, when you need it.  Your next appointment:   DECEMBER 2020  The format for your next appointment:   In Person  Provider:   [name], MD

## 2019-03-14 DIAGNOSIS — I10 Essential (primary) hypertension: Secondary | ICD-10-CM | POA: Diagnosis not present

## 2019-03-21 DIAGNOSIS — Z7189 Other specified counseling: Secondary | ICD-10-CM

## 2019-03-21 HISTORY — DX: Other specified counseling: Z71.89

## 2019-03-21 NOTE — Progress Notes (Signed)
Cardiology Office Note   Date:  03/22/2019   ID:  Alexandra Green, DOB 1940/07/28, MRN [id number]  PCP:  [name], MD  Cardiologist:   [name], MD   Chief Complaint  Patient presents with  . Atrial Flutter      History of Present Illness: Alexandra Green is a 78 y.o. female who presents for follow up of atrial flutter after DCCV. She had a TEE cardioversion and was treated with anticoagulation with Eliquis with CHADS VASC score of 4. Other history includes cardiomyopathy, hypertension, and CHF, with diabetes.    At the last visit she was taking both ACE inhibitor's and ARB.  I cleared this up and started hydralazine.  She is in atrial flutter.  Her rates him and sometimes in the 120s to 100s but probably not adequately controlled is judging by her home monitoring.  She feels occasionally flushed.  She feels occasional palpitations.  Her blood pressures better controlled than it was but still slightly elevated sometimes in the 623 systolic.  She gets around slowly in her house.  She walks when she goes a long distance with a walker.  She is not having any presyncope or syncope.  She denies any chest pressure, neck or arm discomfort.  She has had no new shortness of breath, PND or orthopnea.    Past Medical History:  Diagnosis Date  . Arthritis   . CHF (congestive heart failure) ([place])   . Diabetes mellitus without complication ([place])   . Hypertension     Past Surgical History:  Procedure Laterality Date  . ABDOMINAL HYSTERECTOMY    . CARDIOVERSION N/A 05/17/2016   Procedure: CARDIOVERSION;  Surgeon: [name], MD;  Location: [place];  Service: Cardiovascular;  Laterality: N/A;  . CATARACT EXTRACTION    . CHOLECYSTECTOMY    . TEE WITHOUT CARDIOVERSION N/A 05/17/2016   Procedure: TRANSESOPHAGEAL ECHOCARDIOGRAM (TEE);  Surgeon: [name], MD;  Location: [place];  Service: Cardiovascular;  Laterality: N/A;  . TOTAL KNEE ARTHROPLASTY Left       Current Outpatient Medications  Medication Sig Dispense Refill  . ELIQUIS 5 MG TABS tablet TAKE 1 TABLET BY MOUTH TWICE A DAY 180 tablet 1  . fluticasone (FLONASE) 50 MCG/ACT nasal spray Place 1 spray into both nostrils daily. 16 g 2  . glimepiride (AMARYL) 4 MG tablet Take 4 mg by mouth daily with breakfast.    . hydrALAZINE (APRESOLINE) 10 MG tablet Take 1 tablet (10 mg total) by mouth 2 (two) times daily. 180 tablet 3  . losartan-hydrochlorothiazide (HYZAAR) 50-12.5 MG tablet Take 1 tablet by mouth daily. 90 tablet 3  . metFORMIN (GLUCOPHAGE) 500 MG tablet Take 2,000 mg by mouth at bedtime.     . Multiple Vitamin (MULTIVITAMIN WITH MINERALS) TABS tablet Take 1 tablet by mouth daily.     . Omega-3 Fatty Acids (FISH OIL) 1000 MG CAPS Take 2 capsules by mouth daily.    Glory Rosebush ULTRA test strip     . metoprolol succinate (TOPROL-XL) 100 MG 24 hr tablet Take 1 tablet (100 mg total) by mouth daily. Take with or immediately following a meal. 90 tablet 3   No current facility-administered medications for this visit.    Allergies:   Patient has no known allergies.    ROS:  Please see the history of present illness.   Otherwise, review of systems are positive for none.   All other systems are reviewed and negative.  PHYSICAL EXAM: VS:  BP (!) 150/91   Pulse (!) 105   Temp (!) 96.4 F (35.8 C)   Ht 5\' 4"  (1.626 m)   Wt 236 lb (107 kg)   SpO2 96%   BMI 40.51 kg/m  , BMI Body mass index is 40.51 kg/m. GEN:  No distress NECK:  No jugular venous distention at 90 degrees, waveform within normal limits, carotid upstroke brisk and symmetric, no bruits, no thyromegaly LYMPHATICS:  No cervical adenopathy LUNGS:  Clear to auscultation bilaterally BACK:  No CVA tenderness CHEST:  Unremarkable HEART:  S1 and S2 within normal limits, no S3, no clicks, no rubs, no murmurs, irregular ABD:  Positive bowel sounds normal in frequency in pitch, no bruits, no rebound, no guarding, unable to  assess midline mass or bruit with the patient seated. EXT:  2 plus pulses throughout, moderate edema, no cyanosis no clubbing SKIN:  No rashes no nodules NEURO:  Cranial nerves II through XII grossly intact, motor grossly intact throughout PSYCH:  Cognitively intact, oriented to person place and time   EKG:  EKG is ordered today. The ekg ordered today demonstrates atrial flutter, rate 92, left axis deviation, variable conduction, poor anterior R wave progression   Recent Labs: No results found for requested labs within last 8760 hours.    Lipid Panel No results found for: CHOL, TRIG, HDL, CHOLHDL, VLDL, LDLCALC, LDLDIRECT    Wt Readings from Last 3 Encounters:  03/22/19 236 lb (107 kg)  02/19/19 232 lb 9.6 oz (105.5 kg)  11/16/18 232 lb (105.2 kg)      Other studies Reviewed: Additional studies/ records that were reviewed today include: None. Review of the above records demonstrates:  Please see elsewhere in the note.     ASSESSMENT AND PLAN:  Hypertension:  Her blood pressures slightly elevated but immediate reduction of this in the context of treating her rhythm.   Atrial flutter.  She is still not reasonably rate controlled on carvedilol.  I Minna switch this to metoprolol 100 mg XL daily.  She will let me know what her heart rate is doing and we might need to titrate to 200 mg.  She will also follow her blood pressure on this.  I think is reasonable given the fact that this looks like persistent typical flutter for her to talk to electrophysiology to at least discuss ablation..  Chronic diastolic and systolic CHF:  She seems to be euvolemic.  No change in therapy.   Covid Education: We gave her the flu shot today.  We talked about the vaccine.    Current medicines are reviewed at length with the patient today.  The patient does not have concerns regarding medicines.  The following changes have been made:  no change  Labs/ tests ordered today include:   None  Orders Placed This Encounter  Procedures  . Ambulatory referral to Cardiac Electrophysiology    Disposition:   FU with me in 3 months.      Signed, 01/16/19, MD  03/22/2019 4:14 PM    [facility]

## 2019-03-22 ENCOUNTER — Encounter (INDEPENDENT_AMBULATORY_CARE_PROVIDER_SITE_OTHER): Payer: Self-pay

## 2019-03-22 ENCOUNTER — Other Ambulatory Visit: Payer: Self-pay

## 2019-03-22 ENCOUNTER — Encounter: Payer: Self-pay | Admitting: Cardiology

## 2019-03-22 ENCOUNTER — Ambulatory Visit: Payer: Medicare Other | Admitting: Cardiology

## 2019-03-22 VITALS — BP 150/91 | HR 105 | Temp 96.4°F | Ht 64.0 in | Wt 236.0 lb

## 2019-03-22 DIAGNOSIS — Z7189 Other specified counseling: Secondary | ICD-10-CM

## 2019-03-22 DIAGNOSIS — I110 Hypertensive heart disease with heart failure: Secondary | ICD-10-CM | POA: Diagnosis not present

## 2019-03-22 DIAGNOSIS — I10 Essential (primary) hypertension: Secondary | ICD-10-CM

## 2019-03-22 DIAGNOSIS — I4892 Unspecified atrial flutter: Secondary | ICD-10-CM

## 2019-03-22 DIAGNOSIS — I5042 Chronic combined systolic (congestive) and diastolic (congestive) heart failure: Secondary | ICD-10-CM | POA: Diagnosis not present

## 2019-03-22 DIAGNOSIS — Z23 Encounter for immunization: Secondary | ICD-10-CM

## 2019-03-22 DIAGNOSIS — I5032 Chronic diastolic (congestive) heart failure: Secondary | ICD-10-CM

## 2019-03-22 MED ORDER — METOPROLOL SUCCINATE ER 100 MG PO TB24: 100.0000 mg | ORAL_TABLET | Freq: Every day | ORAL | 3 refills | Status: DC

## 2019-03-22 NOTE — Patient Instructions (Signed)
Medication Instructions:  Your physician has recommended you make the following change in your medication:   STOP TAKING YOUR CARVEDILOL  START METOPROLOL SUCCINATE ( TOPROL-XL) 100 MG. ONE TABLET BY MOUTH DAILY  *If you need a refill on your cardiac medications before your next appointment, please call your pharmacy*  Lab Work: NONE If you have labs (blood work) drawn today and your tests are completely normal, you will receive your results only by: Marland Kitchen MyChart Message (if you have MyChart) OR . A paper copy in the mail If you have any lab test that is abnormal or we need to change your treatment, we will call you to review the results.  Testing/Procedures: NONE  Follow-Up: At [facility], you and your health needs are our priority.  As part of our continuing mission to provide you with exceptional heart care, we have created designated Provider Care Teams.  These Care Teams include your primary Cardiologist (physician) and Advanced Practice Providers (APPs -  Physician Assistants and Nurse Practitioners) who all work together to provide you with the care you need, when you need it.  Your next appointment:   TO BE DETERMINED  The format for your next appointment:   In Person  Provider:   You may see one of the following electrophysiologists:  Thompson Grayer, MD  WILL Curt Bears, MD Cristopher Peru, MD Virl Axe, MD

## 2019-03-26 ENCOUNTER — Other Ambulatory Visit (INDEPENDENT_AMBULATORY_CARE_PROVIDER_SITE_OTHER): Payer: Medicare Other

## 2019-03-26 DIAGNOSIS — I4892 Unspecified atrial flutter: Secondary | ICD-10-CM

## 2019-03-26 DIAGNOSIS — I5032 Chronic diastolic (congestive) heart failure: Secondary | ICD-10-CM

## 2019-03-26 DIAGNOSIS — I4891 Unspecified atrial fibrillation: Secondary | ICD-10-CM | POA: Diagnosis not present

## 2019-03-26 DIAGNOSIS — I10 Essential (primary) hypertension: Secondary | ICD-10-CM

## 2019-04-11 ENCOUNTER — Telehealth: Payer: Self-pay | Admitting: Cardiology

## 2019-04-11 DIAGNOSIS — I10 Essential (primary) hypertension: Secondary | ICD-10-CM | POA: Diagnosis not present

## 2019-04-11 DIAGNOSIS — M130 Polyarthritis, unspecified: Secondary | ICD-10-CM | POA: Diagnosis not present

## 2019-04-11 DIAGNOSIS — I509 Heart failure, unspecified: Secondary | ICD-10-CM | POA: Diagnosis not present

## 2019-04-11 NOTE — Telephone Encounter (Signed)
New message   Nurse is calling in from Dr. Fransico Setters office due to patient's blood pressure being elevated.   Pt c/o BP issue: STAT if pt c/o blurred vision, one-sided weakness or slurred speech  1. What are your last 5 BP readings? 152/110 HR 109, 156/107 HR 104  2. Are you having any other symptoms (ex. Dizziness, headache, blurred vision, passed out)? A lot of pain in her knees  3. What is your BP issue? BP is elevated, nurse states that her BP was in a normal range until patient started metoprolol succinate medication. Please give patient a call to discuss.

## 2019-04-11 NOTE — Telephone Encounter (Signed)
LMTCB

## 2019-04-12 NOTE — Telephone Encounter (Signed)
Called Alexandra Green she stated that she has not been taking her BP. Asked Alexandra Green what symptoms she was having, just stated knee pain. Advised Alexandra Green to talk to PCP regarding knee pain because we do not handle that at the cardiologist. States that she takes her BP medications regularly. Advised Alexandra Green to keep upcoming appt with Dr Curt Bears on 04/17/19. Advised Alexandra Green to call 911 if having any chest pain, dizziness, blurred vision, slurred speech, ect. Verbalized understanding.

## 2019-04-16 ENCOUNTER — Telehealth: Payer: Self-pay | Admitting: Cardiology

## 2019-04-16 NOTE — Telephone Encounter (Signed)
New Message:     Pt says he daughter will be coming with her tomorrow for her appt with Dr Daiva Huge. She says she have problem getting around.

## 2019-04-16 NOTE — Telephone Encounter (Signed)
Discussed w/ pt. Informed ok to have someone come w/ her to appt d/t fall risk.  Pt appreciates our understanding.

## 2019-04-17 ENCOUNTER — Other Ambulatory Visit: Payer: Self-pay

## 2019-04-17 ENCOUNTER — Ambulatory Visit: Payer: Medicare Other | Admitting: Cardiology

## 2019-04-17 ENCOUNTER — Encounter: Payer: Self-pay | Admitting: Cardiology

## 2019-04-17 VITALS — BP 136/88 | HR 105 | Ht 64.0 in | Wt 234.8 lb

## 2019-04-17 DIAGNOSIS — I483 Typical atrial flutter: Secondary | ICD-10-CM

## 2019-04-17 NOTE — Progress Notes (Signed)
Electrophysiology Office Note   Date:  04/17/2019   ID:  Alexandra Green, DOB 1940/09/30, MRN [id number]  PCP:  Alexandra Green  Cardiologist:  Alexandra Green Primary Electrophysiologist:  Pedro Oldenburg Alexandra Green    Chief Complaint: flutter   History of Present Illness: Alexandra Green is a 79 y.o. female who is being seen today for the evaluation of atrial flutter at the request of Alexandra Breeding, Green. Presenting today for electrophysiology evaluation.  Has a history of mild systolic heart failure, diabetes, hypertension, and atrial flutter.  Is currently on Eliquis.  Her heart rates are in the 100s to 120s in atrial flutter.  He occasionally feels flushed with occasional palpitations.  Today, she denies symptoms of palpitations, chest pain, orthopnea, PND, lower extremity edema, claudication, dizziness, presyncope, syncope, bleeding, or neurologic sequela. The patient is tolerating medications without difficulties.  Her main symptoms are shortness of breath, weakness, and fatigue.  These have been occurring since her diagnosis of recurrent atrial flutter.  She says that she usually walks around slowly, due to her shortness of breath.   Past Medical History:  Diagnosis Date  . Acute on chronic combined systolic and diastolic CHF (congestive heart failure) (Alexandra Green)   . Arthritis   . Atrial fibrillation with RVR (Alexandra Green) 05/14/2016  . Chronic diastolic HF (heart failure) (Alexandra Green) 02/17/2019  . Cough 05/31/2018  . Diabetes mellitus without complication (Alexandra Green)   . Educated about COVID-19 virus infection 03/21/2019  . Hypertension   . Leukocytosis 05/15/2016  . New onset atrial fibrillation (Alexandra Green) 05/15/2016  . New onset atrial flutter (Alexandra Green) 05/14/2016  . Non-ischemic cardiomyopathy (Alexandra Green) 08/26/2016   Past Surgical History:  Procedure Laterality Date  . ABDOMINAL HYSTERECTOMY    . CARDIOVERSION N/A 05/17/2016   Procedure: CARDIOVERSION;  Surgeon: Alexandra Green;  Location: [place];  Service:  Cardiovascular;  Laterality: N/A;  . CATARACT EXTRACTION    . CHOLECYSTECTOMY    . TEE WITHOUT CARDIOVERSION N/A 05/17/2016   Procedure: TRANSESOPHAGEAL ECHOCARDIOGRAM (TEE);  Surgeon: Alexandra Green;  Location: [place];  Service: Cardiovascular;  Laterality: N/A;  . TOTAL KNEE ARTHROPLASTY Left      Current Outpatient Medications  Medication Sig Dispense Refill  . ELIQUIS 5 MG TABS tablet TAKE 1 TABLET BY MOUTH TWICE A DAY 180 tablet 1  . fluticasone (FLONASE) 50 MCG/ACT nasal spray Place 1 spray into both nostrils daily. 16 g 2  . glimepiride (AMARYL) 4 MG tablet Take 4 mg by mouth daily with breakfast.    . hydrALAZINE (APRESOLINE) 10 MG tablet Take 1 tablet (10 mg total) by mouth 2 (two) times daily. 180 tablet 3  . losartan-hydrochlorothiazide (HYZAAR) 50-12.5 MG tablet Take 1 tablet by mouth daily. 90 tablet 3  . metFORMIN (GLUCOPHAGE) 500 MG tablet Take 2,000 mg by mouth at bedtime.     . metoprolol succinate (TOPROL-XL) 100 MG 24 hr tablet Take 1 tablet (100 mg total) by mouth daily. Take with or immediately following a meal. 90 tablet 3  . Multiple Vitamin (MULTIVITAMIN WITH MINERALS) TABS tablet Take 1 tablet by mouth daily.     . Omega-3 Fatty Acids (FISH OIL) 1000 MG CAPS Take 2 capsules by mouth daily.    Alexandra Green ULTRA test strip      No current facility-administered medications for this visit.    Allergies:   Patient has no known allergies.   Social History:  The patient  reports that she has never smoked. She has never  used smokeless tobacco. She reports that she does not drink alcohol or use drugs.   Family History:  The patient's family history includes Cirrhosis in her father; Heart attack (age of onset: 3) in her son; Heart failure (age of onset: 43) in her mother.    ROS:  Please see the history of present illness.   Otherwise, review of systems is positive for none.   All other systems are reviewed and negative.    PHYSICAL EXAM: VS:  BP 136/88    Pulse (!) 105   Ht 5\' 4"  (1.626 m)   Wt 234 lb 12.8 oz (106.5 kg)   SpO2 98%   BMI 40.30 kg/m  , BMI Body mass index is 40.3 kg/m. GEN: Well nourished, well developed, in no acute distress  HEENT: normal  Neck: no JVD, carotid bruits, or masses Cardiac: tachycardic, irregular; no murmurs, rubs, or gallops,no edema  Respiratory:  clear to auscultation bilaterally, normal work of breathing GI: soft, nontender, nondistended, + BS MS: no deformity or atrophy  Skin: warm and dry Neuro:  Strength and sensation are intact Psych: euthymic mood, full affect  EKG:  EKG is ordered today. Personal review of the ekg ordered shows atrial flutter, rate 105  Recent Labs: No results found for requested labs within last 8760 hours.    Lipid Panel  No results found for: CHOL, TRIG, HDL, CHOLHDL, VLDL, LDLCALC, LDLDIRECT   Wt Readings from Last 3 Encounters:  04/17/19 234 lb 12.8 oz (106.5 kg)  03/22/19 236 lb (107 kg)  02/19/19 232 lb 9.6 oz (105.5 kg)      Other studies Reviewed: Additional studies/ records that were reviewed today include: TTE 05/17/18  Review of the above records today demonstrates:  - Left ventricle: The cavity size was normal. There was moderate   concentric hypertrophy. Systolic function was mildly to   moderately reduced. The estimated ejection fraction was in the   range of 40% to 45%. Diffuse hypokinesis. - Aortic valve: Trileaflet; normal thickness leaflets. There was no   regurgitation. - Mitral valve: Calcified annulus. Mildly thickened leaflets .   There was mild regurgitation. - Left atrium: The atrium was mildly dilated. - Right ventricle: Systolic function was normal. - Right atrium: The atrium was mildly dilated. - Tricuspid valve: There was moderate regurgitation. - Pulmonic valve: There was mild regurgitation. - Pulmonary arteries: Systolic pressure was mildly increased. PA   peak pressure: 40 mm Hg (S). - Inferior vena cava: The vessel was  dilated. The respirophasic   diameter changes were blunted (< 50%), consistent with elevated   central venous pressure. - Pericardium, extracardiac: There was no pericardial effusion.   ASSESSMENT AND PLAN:  1.  Typical atrial flutter: Currently on metoprolol and Eliquis.  CHA2DS2-VASc of 4.  She would likely benefit from ablation.  We have been over the procedure, and she would like to further consider this.  Risks and benefits were discussed and include bleeding, tamponade, heart block, stroke, among others.  We Zanaya Baize give her information on the procedure and she Labarron Durnin call 07/16/18 back with any answer.  2.  Hypertension: Mildly elevated today but is usually better controlled.  Analena Gama reassess after she gets back into normal rhythm.  3.  Chronic diastolic heart failure: No obvious volume overload.  Plan per primary cardiology.  Case discussed with referring cardiologist  Current medicines are reviewed at length with the patient today.   The patient does not have concerns regarding her medicines.  The following  changes were made today:  none  Labs/ tests ordered today include:  Orders Placed This Encounter  Procedures  . EKG 12-Lead     Disposition:   FU with Krisi Azua 3 months  Signed, Hugo Lybrand Jorja Loa, Green  04/17/2019 10:40 AM     [facility] [address] [place] [address] [phone] (office) [phone] (fax)

## 2019-04-17 NOTE — Patient Instructions (Addendum)
Medication Instructions:  Your physician recommends that you continue on your current medications as directed. Please refer to the Current Medication list given to you today.  * If you need a refill on your cardiac medications before your next appointment, please call your pharmacy.   Labwork: None ordered If you have labs (blood work) drawn today and your tests are completely normal, you will receive your results only by:  [place] (if you have MyChart) OR  A paper copy in the mail If you have any lab test that is abnormal or we need to change your treatment, we will call you to review the results.  Testing/Procedures: Your physician has recommended that you have an ablation. Catheter ablation is a medical procedure used to treat some cardiac arrhythmias (irregular heartbeats). During catheter ablation, a long, thin, flexible tube is put into a blood vessel in your groin (upper thigh), or neck. This tube is called an ablation catheter. It is then guided to your heart through the blood vessel. Radio frequency waves destroy small areas of heart tissue where abnormal heartbeats may cause an arrhythmia to start.   Please call if/when you are ready to schedule  Follow-Up: To be determined  *Please note that any paperwork needing to be filled out by the provider will need to be addressed at the front desk prior to seeing the provider. Please note that any FMLA, disability or other documents regarding health condition is subject to a $25.00 charge that must be received prior to completion of paperwork in the form of a money order or check.  Thank you for choosing [facility]!!   Trinidad Curet, RN [phone]  Any Other Special Instructions Will Be Listed Below (If Applicable).    Electrophysiology/Ablation Procedure Instructions   You are scheduled for a(n)  ablation on _________ with Dr. Allegra Lai.   1.   Pre procedure testing-             A.  LAB WORK --- On __________   for your pre procedure blood work.                 B. COVID TEST-- On ___________ @ ___________ Dennis Bast will go to [facility] hospital ([place]) for your Covid testing.   This is a drive thru test site.  There will be multiple testing areas.  Be sure to share with the first checkpoint that you are there for pre-procedure/surgery testing. This will put you into the right (yellow) lane that leads to the PAT testing team. Stay in your car and the nurse team will come to your car to test you.  After you are tested please go home and self quarantine until the day of your procedure.     2. On the day of your procedure __________ you will go to [facility] hospital [phone] [employer]) at __________.  You will go to the main entrance [address] [employer]) and enter where the [employer] parking staff are.  Your driver will drop you off and you will head down the hallway to ADMITTING.  You may have one support person come in to the hospital with you.  They will be asked to wait in the waiting room.   3.   Do not eat or drink after midnight prior to your procedure.   4.   Do NOT take any medications the morning of your procedure.   5.  Plan for an overnight stay.  If you  use your phone frequently bring your phone charger.   6. You will follow up with the AFIB clinic 4 weeks after your procedure.  You will follow up with Dr. Elberta Fortis  3 months after your procedure.  These appointments will be made for you.   * If you have ANY questions please call the office [phone] and ask for Hera Celaya RN or send me a MyChart message   * Occasionally, EP Studies and ablations can become lengthy.  Please make your family aware of this before your procedure starts.  Average time ranges from 2-8 hours for EP studies/ablations.  Your physician will call your family after the procedure with the results.                                     Cardiac Ablation Cardiac ablation is a procedure to disable (ablate) a small  amount of heart tissue in very specific places. The heart has many electrical connections. Sometimes these connections are abnormal and can cause the heart to beat very fast or irregularly. Ablating some of the problem areas can improve the heart rhythm or return it to normal. Ablation may be done for people who:  Have Wolff-Parkinson-White syndrome.  Have fast heart rhythms (tachycardia).  Have taken medicines for an abnormal heart rhythm (arrhythmia) that were not effective or caused side effects.  Have a high-risk heartbeat that may be life-threatening. During the procedure, a small incision is made in the neck or the groin, and a long, thin, flexible tube (catheter) is inserted into the incision and moved to the heart. Small devices (electrodes) on the tip of the catheter will send out electrical currents. A type of X-ray (fluoroscopy) will be used to help guide the catheter and to provide images of the heart. Tell a health care provider about:  Any allergies you have.  All medicines you are taking, including vitamins, herbs, eye drops, creams, and over-the-counter medicines.  Any problems you or family members have had with anesthetic medicines.  Any blood disorders you have.  Any surgeries you have had.  Any medical conditions you have, such as kidney failure.  Whether you are pregnant or may be pregnant. What are the risks? Generally, this is a safe procedure. However, problems may occur, including:  Infection.  Bruising and bleeding at the catheter insertion site.  Bleeding into the chest, especially into the sac that surrounds the heart. This is a serious complication.  Stroke or blood clots.  Damage to other structures or organs.  Allergic reaction to medicines or dyes.  Need for a permanent pacemaker if the normal electrical system is damaged. A pacemaker is a small computer that sends electrical signals to the heart and helps your heart beat normally.  The  procedure not being fully effective. This may not be recognized until months later. Repeat ablation procedures are sometimes required. What happens before the procedure?  Follow instructions from your health care provider about eating or drinking restrictions.  Ask your health care provider about: ? Changing or stopping your regular medicines. This is especially important if you are taking diabetes medicines or blood thinners. ? Taking medicines such as aspirin and ibuprofen. These medicines can thin your blood. Do not take these medicines before your procedure if your health care provider instructs you not to.  Plan to have someone take you home from the hospital or clinic.  If you will  be going home right after the procedure, plan to have someone with you for 24 hours. What happens during the procedure?  To lower your risk of infection: ? Your health care team will wash or sanitize their hands. ? Your skin will be washed with soap. ? Hair may be removed from the incision area.  An IV tube will be inserted into one of your veins.  You will be given a medicine to help you relax (sedative).  The skin on your neck or groin will be numbed.  An incision will be made in your neck or your groin.  A needle will be inserted through the incision and into a large vein in your neck or groin.  A catheter will be inserted into the needle and moved to your heart.  Dye may be injected through the catheter to help your surgeon see the area of the heart that needs treatment.  Electrical currents will be sent from the catheter to ablate heart tissue in desired areas. There are three types of energy that may be used to ablate heart tissue: ? Heat (radiofrequency energy). ? Laser energy. ? Extreme cold (cryoablation).  When the necessary tissue has been ablated, the catheter will be removed.  Pressure will be held on the catheter insertion area to prevent excessive bleeding.  A bandage  (dressing) will be placed over the catheter insertion area. The procedure may vary among health care providers and hospitals. What happens after the procedure?  Your blood pressure, heart rate, breathing rate, and blood oxygen level will be monitored until the medicines you were given have worn off.  Your catheter insertion area will be monitored for bleeding. You will need to lie still for a few hours to ensure that you do not bleed from the catheter insertion area.  Do not drive for 24 hours or as long as directed by your health care provider. Summary  Cardiac ablation is a procedure to disable (ablate) a small amount of heart tissue in very specific places. Ablating some of the problem areas can improve the heart rhythm or return it to normal.  During the procedure, electrical currents will be sent from the catheter to ablate heart tissue in desired areas. This information is not intended to replace advice given to you by your health care provider. Make sure you discuss any questions you have with your health care provider. Document Revised: 09/19/2017 Document Reviewed: 02/16/2016 Elsevier Patient Education  2020 [employer].

## 2019-04-19 ENCOUNTER — Telehealth: Payer: Self-pay | Admitting: Cardiology

## 2019-04-19 NOTE — Telephone Encounter (Signed)
New message  Patient is calling in to speak with [name] about scheduling her surgery. Please give patient a call back to discuss.

## 2019-04-23 NOTE — Telephone Encounter (Signed)
Apologized for delay in returning call. Pt states she was calling to schedule ablation date. Given 1/27 or week of February 15th.  Pt is going to discuss w/ her dtr. Aware I will follow up tomorrow. Patient verbalized understanding and agreeable to plan.

## 2019-04-26 ENCOUNTER — Telehealth: Payer: Self-pay | Admitting: Cardiology

## 2019-04-26 DIAGNOSIS — I483 Typical atrial flutter: Secondary | ICD-10-CM

## 2019-04-26 DIAGNOSIS — Z01812 Encounter for preprocedural laboratory examination: Secondary | ICD-10-CM

## 2019-04-26 NOTE — Telephone Encounter (Signed)
Patient states she received a call from our office. Wasn't sure if it was from Dr. Jenene Slicker office or Dr. Elberta Fortis. I did not see a note about anyone calling. Routed to [employer] too.

## 2019-04-26 NOTE — Telephone Encounter (Signed)
Patient states she received a call from our office. Wasn't sure if it was from Dr. Jenene Slicker office or Dr. Elberta Fortis. Believes it may have been from [place] but advised [name] was not in today. I did not see a note about anyone calling. Routed to [facility] office too.

## 2019-04-27 NOTE — Telephone Encounter (Signed)
Newnam, Leah at 04/26/2019 4:04 PM  Status: Signed    Patient states she received a call from our office. Wasn't sure if it was from Dr. Jenene Slicker office or Dr. Elberta Fortis. Believes it may have been from [place] but advised Zuleima Haser was not in today. I did not see a note about anyone calling. Routed to [facility] office too.

## 2019-04-27 NOTE — Telephone Encounter (Signed)
Followed up with pt. She would like to have procedure on 1/22. Instructions reviewed with patient (she filled in the blanks of the instruction outline I gave her on 04/17/19) Pre procedure labs scheduled for 05/01/19. Covid screening scheduled for 05/01/19. Pt aware office will call to arrange post procedure follow up. Patient verbalized understanding and agreeable to plan.

## 2019-05-01 ENCOUNTER — Other Ambulatory Visit (HOSPITAL_COMMUNITY)
Admission: RE | Admit: 2019-05-01 | Discharge: 2019-05-01 | Disposition: A | Discharge status: Home / Self Care | Payer: Medicare Other | Source: Ambulatory Visit | Attending: Cardiology | Admitting: Cardiology

## 2019-05-01 DIAGNOSIS — Z01812 Encounter for preprocedural laboratory examination: Secondary | ICD-10-CM | POA: Insufficient documentation

## 2019-05-01 DIAGNOSIS — Z20822 Contact with and (suspected) exposure to covid-19: Secondary | ICD-10-CM | POA: Insufficient documentation

## 2019-05-02 LAB — NOVEL CORONAVIRUS, NAA (HOSP ORDER, SEND-OUT TO REF LAB; TAT 18-24 HRS): SARS-CoV-2, NAA: NOT DETECTED

## 2019-05-04 ENCOUNTER — Other Ambulatory Visit: Payer: Self-pay

## 2019-05-04 ENCOUNTER — Ambulatory Visit (HOSPITAL_COMMUNITY): Payer: Medicare Other | Admitting: Certified Registered Nurse Anesthetist

## 2019-05-04 ENCOUNTER — Ambulatory Visit (HOSPITAL_COMMUNITY)
Admission: RE | Admit: 2019-05-04 | Discharge: 2019-05-04 | Disposition: A | Discharge status: Home / Self Care | Payer: Medicare Other | Attending: Cardiology | Admitting: Cardiology

## 2019-05-04 ENCOUNTER — Encounter (HOSPITAL_COMMUNITY)
Admission: RE | Disposition: A | Discharge status: Home / Self Care | Payer: Medicare Other | Source: Home / Self Care | Attending: Cardiology

## 2019-05-04 DIAGNOSIS — Z79899 Other long term (current) drug therapy: Secondary | ICD-10-CM | POA: Insufficient documentation

## 2019-05-04 DIAGNOSIS — I4891 Unspecified atrial fibrillation: Secondary | ICD-10-CM | POA: Insufficient documentation

## 2019-05-04 DIAGNOSIS — I110 Hypertensive heart disease with heart failure: Secondary | ICD-10-CM | POA: Insufficient documentation

## 2019-05-04 DIAGNOSIS — I5032 Chronic diastolic (congestive) heart failure: Secondary | ICD-10-CM | POA: Diagnosis not present

## 2019-05-04 DIAGNOSIS — I428 Other cardiomyopathies: Secondary | ICD-10-CM | POA: Insufficient documentation

## 2019-05-04 DIAGNOSIS — M1990 Unspecified osteoarthritis, unspecified site: Secondary | ICD-10-CM | POA: Insufficient documentation

## 2019-05-04 DIAGNOSIS — Z7901 Long term (current) use of anticoagulants: Secondary | ICD-10-CM | POA: Diagnosis not present

## 2019-05-04 DIAGNOSIS — Z7984 Long term (current) use of oral hypoglycemic drugs: Secondary | ICD-10-CM | POA: Diagnosis not present

## 2019-05-04 DIAGNOSIS — Z8249 Family history of ischemic heart disease and other diseases of the circulatory system: Secondary | ICD-10-CM | POA: Insufficient documentation

## 2019-05-04 DIAGNOSIS — I5043 Acute on chronic combined systolic (congestive) and diastolic (congestive) heart failure: Secondary | ICD-10-CM | POA: Diagnosis not present

## 2019-05-04 DIAGNOSIS — I4892 Unspecified atrial flutter: Secondary | ICD-10-CM | POA: Diagnosis not present

## 2019-05-04 HISTORY — PX: A-FLUTTER ABLATION: EP1230

## 2019-05-04 LAB — BASIC METABOLIC PANEL
Anion gap: 13 (ref 5–15)
BUN: 12 mg/dL (ref 8–23)
CO2: 28 mmol/L (ref 22–32)
Calcium: 8.6 mg/dL — ABNORMAL LOW (ref 8.9–10.3)
Chloride: 102 mmol/L (ref 98–111)
Creatinine, Ser: 0.96 mg/dL (ref 0.44–1.00)
GFR calc Af Amer: >60 mL/min (ref >60)
GFR calc non Af Amer: 57 mL/min — ABNORMAL LOW (ref >60)
Glucose, Bld: 107 mg/dL — ABNORMAL HIGH (ref 70–99)
Potassium: 3 mmol/L — ABNORMAL LOW (ref 3.5–5.1)
Sodium: 143 mmol/L (ref 135–145)

## 2019-05-04 LAB — GLUCOSE, CAPILLARY
Glucose-Capillary: 114 mg/dL — ABNORMAL HIGH (ref 70–99)
Glucose-Capillary: 110 mg/dL — ABNORMAL HIGH (ref 70–99)

## 2019-05-04 LAB — CBC
HCT: 42 % (ref 36.0–46.0)
Hemoglobin: 13.5 g/dL (ref 12.0–15.0)
MCH: 28.9 pg (ref 26.0–34.0)
MCHC: 32.1 g/dL (ref 30.0–36.0)
MCV: 89.9 fL (ref 80.0–100.0)
Platelets: 290 10*3/uL (ref 150–400)
RBC: 4.67 MIL/uL (ref 3.87–5.11)
RDW: 16.8 % — ABNORMAL HIGH (ref 11.5–15.5)
WBC: 9.4 10*3/uL (ref 4.0–10.5)
nRBC: 0 % (ref 0.0–0.2)

## 2019-05-04 SURGERY — A-FLUTTER ABLATION
Anesthesia: General

## 2019-05-04 MED ORDER — ONDANSETRON HCL 4 MG/2ML IJ SOLN: 4.0000 mg | Freq: Four times a day (QID) | INTRAMUSCULAR | Status: DC | PRN

## 2019-05-04 MED ORDER — PHENYLEPHRINE HCL-NACL 10-0.9 MG/250ML-% IV SOLN
INTRAVENOUS | Status: DC | PRN
  Administered 2019-05-04: 10 ug/min via INTRAVENOUS

## 2019-05-04 MED ORDER — SODIUM CHLORIDE 0.9 % IV SOLN: 250.0000 mL | INTRAVENOUS | Status: DC | PRN

## 2019-05-04 MED ORDER — LIDOCAINE 2% (20 MG/ML) 5 ML SYRINGE
INTRAMUSCULAR | Status: DC | PRN
  Administered 2019-05-04: 100 mg via INTRAVENOUS

## 2019-05-04 MED ORDER — BUPIVACAINE HCL (PF) 0.25 % IJ SOLN
INTRAMUSCULAR | Status: AC
  Filled 2019-05-04: qty 30

## 2019-05-04 MED ORDER — DEXAMETHASONE SODIUM PHOSPHATE 10 MG/ML IJ SOLN
INTRAMUSCULAR | Status: DC | PRN
  Administered 2019-05-04: 8 mg via INTRAVENOUS

## 2019-05-04 MED ORDER — HEPARIN (PORCINE) IN NACL 1000-0.9 UT/500ML-% IV SOLN
INTRAVENOUS | Status: AC
  Filled 2019-05-04: qty 500

## 2019-05-04 MED ORDER — ROCURONIUM BROMIDE 10 MG/ML (PF) SYRINGE
PREFILLED_SYRINGE | INTRAVENOUS | Status: DC | PRN
  Administered 2019-05-04: 50 mg via INTRAVENOUS

## 2019-05-04 MED ORDER — POTASSIUM CHLORIDE CRYS ER 20 MEQ PO TBCR
30.0000 meq | EXTENDED_RELEASE_TABLET | ORAL | Status: AC
  Administered 2019-05-04 (×2): 30 meq via ORAL
  Filled 2019-05-04 (×3): qty 1

## 2019-05-04 MED ORDER — SODIUM CHLORIDE 0.9% FLUSH: 3.0000 mL | Freq: Two times a day (BID) | INTRAVENOUS | Status: DC

## 2019-05-04 MED ORDER — ACETAMINOPHEN 325 MG PO TABS: 650.0000 mg | ORAL_TABLET | ORAL | Status: DC | PRN

## 2019-05-04 MED ORDER — BUPIVACAINE HCL (PF) 0.25 % IJ SOLN
INTRAMUSCULAR | Status: DC | PRN
  Administered 2019-05-04: 30 mL

## 2019-05-04 MED ORDER — GLYCOPYRROLATE 0.2 MG/ML IJ SOLN
INTRAMUSCULAR | Status: DC | PRN
  Administered 2019-05-04: .4 mg via INTRAVENOUS

## 2019-05-04 MED ORDER — NEOSTIGMINE METHYLSULFATE 10 MG/10ML IV SOLN
INTRAVENOUS | Status: DC | PRN
  Administered 2019-05-04: 3 mg via INTRAVENOUS

## 2019-05-04 MED ORDER — SODIUM CHLORIDE 0.9% FLUSH: 3.0000 mL | INTRAVENOUS | Status: DC | PRN

## 2019-05-04 MED ORDER — PROPOFOL 10 MG/ML IV BOLUS
INTRAVENOUS | Status: DC | PRN
  Administered 2019-05-04: 130 mg via INTRAVENOUS

## 2019-05-04 MED ORDER — HEPARIN (PORCINE) IN NACL 1000-0.9 UT/500ML-% IV SOLN
INTRAVENOUS | Status: DC | PRN
  Administered 2019-05-04 (×2): 500 mL

## 2019-05-04 MED ORDER — FENTANYL CITRATE (PF) 250 MCG/5ML IJ SOLN
INTRAMUSCULAR | Status: DC | PRN
  Administered 2019-05-04: 100 ug via INTRAVENOUS

## 2019-05-04 MED ORDER — ONDANSETRON HCL 4 MG/2ML IJ SOLN
INTRAMUSCULAR | Status: DC | PRN
  Administered 2019-05-04: 4 mg via INTRAVENOUS

## 2019-05-04 MED ORDER — SODIUM CHLORIDE 0.9 % IV SOLN: INTRAVENOUS | Status: DC

## 2019-05-04 MED ORDER — PHENYLEPHRINE 40 MCG/ML (10ML) SYRINGE FOR IV PUSH (FOR BLOOD PRESSURE SUPPORT)
PREFILLED_SYRINGE | INTRAVENOUS | Status: DC | PRN
  Administered 2019-05-04 (×2): 80 ug via INTRAVENOUS

## 2019-05-04 SURGICAL SUPPLY — 12 items
CATH EZ STEER NAV 8MM F-J CUR (ABLATOR) ×2 IMPLANT
CATH WEBSTER BI DIR CS D-F CRV (CATHETERS) ×2 IMPLANT
DEVICE CLOSURE PERCLS PRGLD 6F (VASCULAR PRODUCTS) ×2 IMPLANT
PACK EP LATEX FREE (CUSTOM PROCEDURE TRAY) ×1
PACK EP LF (CUSTOM PROCEDURE TRAY) ×1 IMPLANT
PAD PRO RADIOLUCENT 2001M-C (PAD) ×2 IMPLANT
PATCH CARTO3 (PAD) ×2 IMPLANT
PERCLOSE PROGLIDE 6F (VASCULAR PRODUCTS) ×4
SHEATH PINNACLE 7F 10CM (SHEATH) ×2 IMPLANT
SHEATH PINNACLE 8F 10CM (SHEATH) ×2 IMPLANT
SHEATH PROBE COVER 6X72 (BAG) ×2 IMPLANT
SHEATH SWARTZ RAMP 8.5F 60CM (SHEATH) ×2 IMPLANT

## 2019-05-04 NOTE — Discharge Instructions (Signed)
Post procedure care instructions No driving for 4 days. No lifting over 5 lbs for 1 week. No vigorous or sexual activity for 1 week. You may return to work/your usual activities on 05/11/2019. Keep procedure site clean & dry. If you notice increased pain, swelling, bleeding or pus, call/return!  You may shower, but no soaking baths/hot tubs/pools for 1 week.    Cardiac Ablation, Care After This sheet gives you information about how to care for yourself after your procedure. Your health care provider may also give you more specific instructions. If you have problems or questions, contact your health care provider. What can I expect after the procedure? After the procedure, it is common to have:  Bruising around your puncture site.  Tenderness around your puncture site.  Skipped heartbeats.  Tiredness (fatigue). Follow these instructions at home: Puncture site care   Follow instructions from your health care provider about how to take care of your puncture site. Make sure you: ? Wash your hands with soap and water before you change your bandage (dressing). If soap and water are not available, use hand sanitizer. ? Remove your dressing as told by your health care provider. In 24-48 hours  Check your puncture site every day for signs of infection. Check for: ? Redness, swelling, or pain. ? Fluid or blood. If your puncture site starts to bleed, lie down on your back, apply firm pressure to the area, and contact your health care provider. ? Warmth. ? Pus or a bad smell. Driving  Ask your health care provider when it is safe for you to drive again after the procedure.  Do not drive or use heavy machinery while taking prescription pain medicine.  Do not drive for 24 hours if you were given a medicine to help you relax (sedative) during your procedure. Activity  Avoid activities that take a lot of effort for at least 3 days after your procedure.  Do not lift anything that is heavier  than 5 lb for 1 week.  Return to your normal activities as told by your health care provider. Ask your health care provider what activities are safe for you. General instructions  Take over-the-counter and prescription medicines only as told by your health care provider.  Do not use any products that contain nicotine or tobacco, such as cigarettes and e-cigarettes. If you need help quitting, ask your health care provider.  Do not take baths, swim, or use a hot tub until your health care provider approves.  Do not drink alcohol for 24 hours after your procedure.  Keep all follow-up visits as told by your health care provider. This is important. Contact a health care provider if:  You have redness, mild swelling, or pain around your puncture site.  You have fluid or blood coming from your puncture site that stops after applying firm pressure to the area.  Your puncture site feels warm to the touch.  You have pus or a bad smell coming from your puncture site.  You have a fever.  You have chest pain or discomfort that spreads to your neck, jaw, or arm.  You are sweating a lot.  You feel nauseous.  You have a fast or irregular heartbeat.  You have shortness of breath.  You are dizzy or light-headed and feel the need to lie down.  You have pain or numbness in the arm or leg closest to your puncture site. Get help right away if:  Your puncture site suddenly swells.  Your  puncture site is bleeding and the bleeding does not stop after applying firm pressure to the area. These symptoms may represent a serious problem that is an emergency. Do not wait to see if the symptoms will go away. Get medical help right away. Call your local emergency services (911 in the [place] Do not drive yourself to the hospital. Summary  After the procedure, it is normal to have bruising and tenderness at the puncture site in your groin, neck, or forearm.  Check your puncture site every day for signs  of infection.  Get help right away if your puncture site is bleeding and the bleeding does not stop after applying firm pressure to the area. This is a medical emergency. This information is not intended to replace advice given to you by your health care provider. Make sure you discuss any questions you have with your health care provider. Document Revised: 03/11/2017 Document Reviewed: 07/08/2016 Elsevier Patient Education  2020 [employer].

## 2019-05-04 NOTE — H&P (Signed)
Alexandra Green has presented today for surgery, with the diagnosis of atrial flutter.  The various methods of treatment have been discussed with the patient and family. After consideration of risks, benefits and other options for treatment, the patient has consented to  Procedure(s): Catheter ablation as a surgical intervention .  Risks include but not limited to bleeding, tamponade, heart block, stroke, damage to surrounding organs, among others. The patient's history has been reviewed, patient examined, no change in status, stable for surgery.  I have reviewed the patient's chart and labs.  Questions were answered to the patient's satisfaction.    Alexandra Walthers Elberta Fortis, MD 05/04/2019 10:51 AM

## 2019-05-04 NOTE — Transfer of Care (Signed)
Immediate Anesthesia Transfer of Care Note  Patient: Alexandra Green  Procedure(s) Performed: A-FLUTTER ABLATION (N/A )  Patient Location: PACU and Cath Lab  Anesthesia Type:General  Level of Consciousness: patient cooperative and responds to stimulation  Airway & Oxygen Therapy: Patient Spontanous Breathing and Patient connected to nasal cannula oxygen  Post-op Assessment: Report given to RN and Post -op Vital signs reviewed and stable  Post vital signs: Reviewed and stable  Last Vitals:  Vitals Value Taken Time  BP 128/90 05/04/19 1353  Temp 36.6 C 05/04/19 1352  Pulse 69 05/04/19 1355  Resp 33 05/04/19 1355  SpO2 100 % 05/04/19 1355  Vitals shown include unvalidated device data.  Last Pain:  Vitals:   05/04/19 1352  TempSrc: Temporal  PainSc: Asleep         Complications: No apparent anesthesia complications

## 2019-05-04 NOTE — Anesthesia Preprocedure Evaluation (Signed)
Anesthesia Evaluation  Patient identified by MRN, date of birth, ID band Patient awake    Reviewed: Allergy & Precautions, NPO status , Patient's Chart, lab work & pertinent test results  Airway Mallampati: II  TM Distance: >3 FB     Dental   Pulmonary    breath sounds clear to auscultation       Cardiovascular hypertension, +CHF   Rhythm:Regular Rate:Normal     Neuro/Psych    GI/Hepatic negative GI ROS, Neg liver ROS, (+)     (-) substance abuse  ,   Endo/Other  diabetes  Renal/GU      Musculoskeletal   Abdominal   Peds  Hematology   Anesthesia Other Findings   Reproductive/Obstetrics                             Anesthesia Physical Anesthesia Plan  ASA: III  Anesthesia Plan: General   Post-op Pain Management:    Induction: Intravenous  PONV Risk Score and Plan: 3 and Ondansetron, Dexamethasone and Midazolam  Airway Management Planned: Oral ETT  Additional Equipment:   Intra-op Plan:   Post-operative Plan: Possible Post-op intubation/ventilation  Informed Consent: I have reviewed the patients History and Physical, chart, labs and discussed the procedure including the risks, benefits and alternatives for the proposed anesthesia with the patient or authorized representative who has indicated his/her understanding and acceptance.     Dental advisory given  Plan Discussed with: CRNA and Anesthesiologist  Anesthesia Plan Comments:         Anesthesia Quick Evaluation

## 2019-05-04 NOTE — Anesthesia Postprocedure Evaluation (Signed)
Anesthesia Post Note  Patient: Alexandra Green  Procedure(s) Performed: A-FLUTTER ABLATION (N/A )     Patient location during evaluation: PACU Anesthesia Type: General Level of consciousness: awake Pain management: pain level controlled Vital Signs Assessment: post-procedure vital signs reviewed and stable Respiratory status: spontaneous breathing Cardiovascular status: stable Postop Assessment: no apparent nausea or vomiting Anesthetic complications: no    Last Vitals:  Vitals:   05/04/19 1452 05/04/19 1509  BP:  136/85  Pulse:  68  Resp:  19  Temp: 36.6 C   SpO2:  94%    Last Pain:  Vitals:   05/04/19 1507  TempSrc:   PainSc: 0-No pain                 Kue Fox

## 2019-06-08 ENCOUNTER — Other Ambulatory Visit: Payer: Self-pay | Admitting: Adult Health

## 2019-06-11 ENCOUNTER — Encounter: Payer: Self-pay | Admitting: Cardiology

## 2019-06-11 ENCOUNTER — Ambulatory Visit: Payer: Medicare Other | Admitting: Cardiology

## 2019-06-11 ENCOUNTER — Other Ambulatory Visit: Payer: Self-pay

## 2019-06-11 VITALS — BP 132/86 | HR 61 | Ht 64.0 in | Wt 222.0 lb

## 2019-06-11 DIAGNOSIS — I483 Typical atrial flutter: Secondary | ICD-10-CM

## 2019-06-11 NOTE — Progress Notes (Signed)
Electrophysiology Office Note   Date:  06/11/2019   ID:  Alexandra Green, DOB 1941-03-10, MRN [id number]  PCP:  [name], MD  Cardiologist:  [place] Primary Electrophysiologist:  Neli Fofana [name], MD    Chief Complaint: flutter   History of Present Illness: Alexandra Green is a 79 y.o. female who is being seen today for the evaluation of atrial flutter at the request of [name], MD. Presenting today for electrophysiology evaluation.  Has a history of mild systolic heart failure, diabetes, hypertension, and atrial flutter.  Is currently on Eliquis.  Her heart rates are in the 100s to 120s in atrial flutter.  He occasionally feels flushed with occasional palpitations.  Today, denies symptoms of palpitations, chest pain, shortness of breath, orthopnea, PND, lower extremity edema, claudication, dizziness, presyncope, syncope, bleeding, or neurologic sequela. The patient is tolerating medications without difficulties.  Overall she has done much better since her ablation.  She has no chest pain or shortness of breath.  She is able to climb stairs on a limited basis which she was unable to do prior to her ablation.   Past Medical History:  Diagnosis Date  . Acute on chronic combined systolic and diastolic CHF (congestive heart failure) ([place])   . Arthritis   . Atrial fibrillation with RVR ([place]) 05/14/2016  . Chronic diastolic HF (heart failure) ([place]) 02/17/2019  . Cough 05/31/2018  . Diabetes mellitus without complication ([place])   . Educated about COVID-19 virus infection 03/21/2019  . Hypertension   . Leukocytosis 05/15/2016  . New onset atrial fibrillation ([place]) 05/15/2016  . New onset atrial flutter ([place]) 05/14/2016  . Non-ischemic cardiomyopathy ([place]) 08/26/2016   Past Surgical History:  Procedure Laterality Date  . A-FLUTTER ABLATION N/A 05/04/2019   Procedure: A-FLUTTER ABLATION;  Surgeon: Constance Haw, MD;  Location: [facility];  Service: Cardiovascular;   Laterality: N/A;  . ABDOMINAL HYSTERECTOMY    . CARDIOVERSION N/A 05/17/2016   Procedure: CARDIOVERSION;  Surgeon: [name], MD;  Location: [facility];  Service: Cardiovascular;  Laterality: N/A;  . CATARACT EXTRACTION    . CHOLECYSTECTOMY    . TEE WITHOUT CARDIOVERSION N/A 05/17/2016   Procedure: TRANSESOPHAGEAL ECHOCARDIOGRAM (TEE);  Surgeon: [name], MD;  Location: [facility] ENDOSCOPY;  Service: Cardiovascular;  Laterality: N/A;  . TOTAL KNEE ARTHROPLASTY Left      Current Outpatient Medications  Medication Sig Dispense Refill  . glimepiride (AMARYL) 4 MG tablet Take 4 mg by mouth daily with breakfast.    . hydrALAZINE (APRESOLINE) 10 MG tablet Take 1 tablet (10 mg total) by mouth 2 (two) times daily. 180 tablet 3  . losartan-hydrochlorothiazide (HYZAAR) 100-25 MG tablet Take 1 tablet by mouth daily.    . metFORMIN (GLUCOPHAGE) 500 MG tablet Take 2,000 mg by mouth at bedtime.     . metoprolol succinate (TOPROL-XL) 100 MG 24 hr tablet Take 1 tablet (100 mg total) by mouth daily. Take with or immediately following a meal. 90 tablet 3  . Multiple Vitamin (MULTIVITAMIN WITH MINERALS) TABS tablet Take 1 tablet by mouth daily.     . NONFORMULARY OR COMPOUNDED ITEM Apply 1 application topically 4 (four) times daily as needed (right knee pain.). 3%Diclofenac-2%Baclofen-5%Gabapentin-5%Lidocaine-1%Menthol (apply liberally up to 4 times daily for affected joint)    . Omega-3 Fatty Acids (FISH OIL) 1200 MG CAPS Take 1,200 mg by mouth 2 (two) times daily.    Glory Rosebush ULTRA test strip      No current facility-administered  medications for this visit.    Allergies:   Patient has no known allergies.   Social History:  The patient  reports that she has never smoked. She has never used smokeless tobacco. She reports that she does not drink alcohol or use drugs.   Family History:  The patient's family history includes Cirrhosis in her father; Heart attack (age of onset: 69) in her son; Heart  failure (age of onset: 62) in her mother.   ROS:  Please see the history of present illness.   Otherwise, review of systems is positive for none.   All other systems are reviewed and negative.   PHYSICAL EXAM: VS:  BP 132/86   Pulse 61   Ht 5\' 4"  (1.626 m)   Wt 222 lb (100.7 kg)   SpO2 97%   BMI 38.11 kg/m  , BMI Body mass index is 38.11 kg/m. GEN: Well nourished, well developed, in no acute distress  HEENT: normal  Neck: no JVD, carotid bruits, or masses Cardiac: RRR; no murmurs, rubs, or gallops,no edema  Respiratory:  clear to auscultation bilaterally, normal work of breathing GI: soft, nontender, nondistended, + BS MS: no deformity or atrophy  Skin: warm and dry Neuro:  Strength and sensation are intact Psych: euthymic mood, full affect  EKG:  EKG is ordered today. Personal review of the ekg ordered shows sinus rhythm, rate 61  Recent Labs: 05/04/2019: BUN 12; Creatinine, Ser 0.96; Hemoglobin 13.5; Platelets 290; Potassium 3.0; Sodium 143    Lipid Panel  No results found for: CHOL, TRIG, HDL, CHOLHDL, VLDL, LDLCALC, LDLDIRECT   Wt Readings from Last 3 Encounters:  06/11/19 222 lb (100.7 kg)  05/04/19 234 lb (106.1 kg)  04/17/19 234 lb 12.8 oz (106.5 kg)      Other studies Reviewed: Additional studies/ records that were reviewed today include: TTE 05/17/18  Review of the above records today demonstrates:  - Left ventricle: The cavity size was normal. There was moderate   concentric hypertrophy. Systolic function was mildly to   moderately reduced. The estimated ejection fraction was in the   range of 40% to 45%. Diffuse hypokinesis. - Aortic valve: Trileaflet; normal thickness leaflets. There was no   regurgitation. - Mitral valve: Calcified annulus. Mildly thickened leaflets .   There was mild regurgitation. - Left atrium: The atrium was mildly dilated. - Right ventricle: Systolic function was normal. - Right atrium: The atrium was mildly dilated. - Tricuspid  valve: There was moderate regurgitation. - Pulmonic valve: There was mild regurgitation. - Pulmonary arteries: Systolic pressure was mildly increased. PA   peak pressure: 40 mm Hg (S). - Inferior vena cava: The vessel was dilated. The respirophasic   diameter changes were blunted (< 50%), consistent with elevated   central venous pressure. - Pericardium, extracardiac: There was no pericardial effusion.   ASSESSMENT AND PLAN:  1.  Typical atrial flutter: Currently on metoprolol and Eliquis.  CHA2DS2-VASc of 4.  Status post atrial flutter ablation 05/04/2019.  Currently she remains in sinus rhythm.  She feels better with much less weakness and fatigue.  We Torien Ramroop plan to stop her Eliquis today.  2.  Hypertension: Currently well controlled  3.  Chronic diastolic heart failure: No obvious volume overload.  Plan per primary cardiology.  Case discussed with primary cardiology  Current medicines are reviewed at length with the patient today.   The patient does not have concerns regarding her medicines.  The following changes were made today: Stop Eliquis  Labs/ tests  ordered today include:  Orders Placed This Encounter  Procedures  . EKG 12-Lead     Disposition:   FU with Marisela Line as needed months  Signed, Yanil Dawe Jorja Loa, MD  06/11/2019 4:26 PM     [facility] [address] [place] [address] [phone] (office) [phone] (fax)

## 2019-06-13 DIAGNOSIS — M130 Polyarthritis, unspecified: Secondary | ICD-10-CM | POA: Diagnosis not present

## 2019-06-13 DIAGNOSIS — I509 Heart failure, unspecified: Secondary | ICD-10-CM | POA: Diagnosis not present

## 2019-06-13 DIAGNOSIS — I10 Essential (primary) hypertension: Secondary | ICD-10-CM | POA: Diagnosis not present

## 2019-06-25 DIAGNOSIS — I480 Paroxysmal atrial fibrillation: Secondary | ICD-10-CM | POA: Diagnosis not present

## 2019-06-25 DIAGNOSIS — I10 Essential (primary) hypertension: Secondary | ICD-10-CM | POA: Diagnosis not present

## 2019-06-25 DIAGNOSIS — E1169 Type 2 diabetes mellitus with other specified complication: Secondary | ICD-10-CM | POA: Diagnosis not present

## 2019-07-25 DIAGNOSIS — I10 Essential (primary) hypertension: Secondary | ICD-10-CM | POA: Diagnosis not present

## 2019-07-25 DIAGNOSIS — E1169 Type 2 diabetes mellitus with other specified complication: Secondary | ICD-10-CM | POA: Diagnosis not present

## 2019-08-09 DIAGNOSIS — I10 Essential (primary) hypertension: Secondary | ICD-10-CM | POA: Diagnosis not present

## 2019-10-24 DIAGNOSIS — E1169 Type 2 diabetes mellitus with other specified complication: Secondary | ICD-10-CM | POA: Diagnosis not present

## 2019-10-24 DIAGNOSIS — I10 Essential (primary) hypertension: Secondary | ICD-10-CM | POA: Diagnosis not present

## 2019-10-24 DIAGNOSIS — R5383 Other fatigue: Secondary | ICD-10-CM | POA: Diagnosis not present

## 2019-12-05 DIAGNOSIS — E069 Thyroiditis, unspecified: Secondary | ICD-10-CM | POA: Diagnosis not present

## 2019-12-05 DIAGNOSIS — E11641 Type 2 diabetes mellitus with hypoglycemia with coma: Secondary | ICD-10-CM | POA: Diagnosis not present

## 2019-12-05 DIAGNOSIS — E1169 Type 2 diabetes mellitus with other specified complication: Secondary | ICD-10-CM | POA: Diagnosis not present

## 2019-12-05 DIAGNOSIS — I10 Essential (primary) hypertension: Secondary | ICD-10-CM | POA: Diagnosis not present

## 2019-12-05 DIAGNOSIS — E034 Atrophy of thyroid (acquired): Secondary | ICD-10-CM | POA: Diagnosis not present

## 2019-12-11 DIAGNOSIS — I480 Paroxysmal atrial fibrillation: Secondary | ICD-10-CM | POA: Diagnosis not present

## 2019-12-11 DIAGNOSIS — I10 Essential (primary) hypertension: Secondary | ICD-10-CM | POA: Diagnosis not present

## 2019-12-11 DIAGNOSIS — E1169 Type 2 diabetes mellitus with other specified complication: Secondary | ICD-10-CM | POA: Diagnosis not present

## 2019-12-21 ENCOUNTER — Other Ambulatory Visit: Payer: Self-pay | Admitting: Cardiology

## 2019-12-21 DIAGNOSIS — I10 Essential (primary) hypertension: Secondary | ICD-10-CM

## 2019-12-28 DIAGNOSIS — Z23 Encounter for immunization: Secondary | ICD-10-CM | POA: Diagnosis not present

## 2020-01-10 DIAGNOSIS — I10 Essential (primary) hypertension: Secondary | ICD-10-CM | POA: Diagnosis not present

## 2020-01-10 DIAGNOSIS — E1169 Type 2 diabetes mellitus with other specified complication: Secondary | ICD-10-CM | POA: Diagnosis not present

## 2020-01-10 DIAGNOSIS — I480 Paroxysmal atrial fibrillation: Secondary | ICD-10-CM | POA: Diagnosis not present

## 2020-01-10 NOTE — Progress Notes (Signed)
Cardiology Office Note   Date:  01/11/2020   ID:  SHAKOYA Green, DOB [birth year], MRN [id number]  PCP:  [name], MD  Cardiologist:   Rollene Rotunda, MD   Chief Complaint  Patient presents with  . Palpitations      History of Present Illness: Alexandra Green is a 79 y.o. female who presents for follow up of atrial flutter after DCCV. She had a TEE cardioversion and was treated with anticoagulation with Eliquis with CHADS VASC score of 4. Other history includes cardiomyopathy, hypertension, and CHF, with diabetes.    Since I last saw her she had ablation by Dr. Elberta Fortis.  Because this was thought to be successful she was taken off of Eliquis.  However, she has had Alive Cor and she thinks that she has been out of rhythm although when she describes it it might be PACs rather than flutter.  She might feel the palpitations.  She does not have any rapid rates.  She does not have any presyncope or syncope.  She is fatigued.  She is mostly limited and walks slowly with a walker because of back and joint problems.   Past Medical History:  Diagnosis Date  . Acute on chronic combined systolic and diastolic CHF (congestive heart failure) ([facility])   . Arthritis   . Atrial fibrillation with RVR ([facility]) 05/14/2016  . Chronic diastolic HF (heart failure) ([facility]) 02/17/2019  . Cough 05/31/2018  . Diabetes mellitus without complication ([facility])   . Educated about COVID-19 virus infection 03/21/2019  . Hypertension   . Leukocytosis 05/15/2016  . New onset atrial fibrillation ([facility]) 05/15/2016  . New onset atrial flutter ([facility]) 05/14/2016  . Non-ischemic cardiomyopathy ([facility]) 08/26/2016    Past Surgical History:  Procedure Laterality Date  . A-FLUTTER ABLATION N/A 05/04/2019   Procedure: A-FLUTTER ABLATION;  Surgeon: Regan Lemming, MD;  Location: [facility];  Service: Cardiovascular;  Laterality: N/A;  . ABDOMINAL HYSTERECTOMY    . CARDIOVERSION N/A 05/17/2016   Procedure: CARDIOVERSION;   Surgeon: Jake Bathe, MD;  Location: [facility] ENDOSCOPY;  Service: Cardiovascular;  Laterality: N/A;  . CATARACT EXTRACTION    . CHOLECYSTECTOMY    . TEE WITHOUT CARDIOVERSION N/A 05/17/2016   Procedure: TRANSESOPHAGEAL ECHOCARDIOGRAM (TEE);  Surgeon: Jake Bathe, MD;  Location: [facility] ENDOSCOPY;  Service: Cardiovascular;  Laterality: N/A;  . TOTAL KNEE ARTHROPLASTY Left      Current Outpatient Medications  Medication Sig Dispense Refill  . glimepiride (AMARYL) 4 MG tablet Take 4 mg by mouth daily with breakfast.    . hydrALAZINE (APRESOLINE) 10 MG tablet TAKE 1 TABLET BY MOUTH TWICE A DAY 180 tablet 1  . losartan-hydrochlorothiazide (HYZAAR) 100-25 MG tablet Take 1 tablet by mouth daily.    . metFORMIN (GLUCOPHAGE) 500 MG tablet Take 2,000 mg by mouth at bedtime.     . metoprolol succinate (TOPROL-XL) 100 MG 24 hr tablet Take 1 tablet (100 mg total) by mouth daily. Take with or immediately following a meal. 90 tablet 3  . Multiple Vitamin (MULTIVITAMIN WITH MINERALS) TABS tablet Take 1 tablet by mouth daily.     . NONFORMULARY OR COMPOUNDED ITEM Apply 1 application topically 4 (four) times daily as needed (right knee pain.). 3%Diclofenac-2%Baclofen-5%Gabapentin-5%Lidocaine-1%Menthol (apply liberally up to 4 times daily for affected joint)    . Omega-3 Fatty Acids (FISH OIL) 1200 MG CAPS Take 1,200 mg by mouth 2 (two) times daily.    [name] ULTRA test strip  No current facility-administered medications for this visit.    Allergies:   Patient has no known allergies.    ROS:  Please see the history of present illness.   Otherwise, review of systems are positive for none.   All other systems are reviewed and negative.    PHYSICAL EXAM: VS:  BP 132/60   Pulse (!) 57   Ht 5\' 4"  (1.626 m)   Wt 214 lb 9.6 oz (97.3 kg)   BMI 36.84 kg/m  , BMI Body mass index is 36.84 kg/m. GEN:  No distress NECK:  No jugular venous distention at 90 degrees, waveform within normal limits, carotid  upstroke brisk and symmetric, no bruits, no thyromegaly LYMPHATICS:  No cervical adenopathy LUNGS:  Clear to auscultation bilaterally BACK:  No CVA tenderness CHEST:  Unremarkable HEART:  S1 and S2 within normal limits, no S3, no S4, no clicks, no rubs, 3 out of 6 apical systolic murmur radiating up uric outflow tract, no diastolic murmurs ABD:  Positive bowel sounds normal in frequency in pitch, no bruits, no rebound, no guarding, unable to assess midline mass or bruit with the patient seated. EXT:  2 plus pulses throughout, no edema, no cyanosis no clubbing SKIN:  No rashes no nodules NEURO:  Cranial nerves II through XII grossly intact, motor grossly intact throughout PSYCH:  Cognitively intact, oriented to person place and time  EKG:  EKG is ordered today. The ekg ordered today demonstrates atrial flutter, rate 57, left axis deviation, variable conduction, poor anterior R wave progression.  PACs   Recent Labs: 05/04/2019: BUN 12; Creatinine, Ser 0.96; Hemoglobin 13.5; Platelets 290; Potassium 3.0; Sodium 143    Lipid Panel No results found for: CHOL, TRIG, HDL, CHOLHDL, VLDL, LDLCALC, LDLDIRECT    Wt Readings from Last 3 Encounters:  01/11/20 214 lb 9.6 oz (97.3 kg)  06/11/19 222 lb (100.7 kg)  05/04/19 234 lb (106.1 kg)      Other studies Reviewed: Additional studies/ records that were reviewed today include: None. Review of the above records demonstrates: See elsewhere  ASSESSMENT AND PLAN:  Hypertension:  Her blood pressures are at target.  No change in therapy.   Atrial flutter.   She is in sinus rhythm today.  However, I am going to apply a Holter monitor to make sure she is not having any flutter.  I am also asking her to see if his family members can help her send me some recordings from her Alive Cor.    Chronic diastolic and systolic CHF:    She seems to be euvolemic.  No change in therapy.  Cardiomyopathy: She has had a mildly reduced ejection fraction 40 to  45%.  She had some moderate concentric hypertrophy.  She also has a murmur that sounds more prominent than previous.  This is likely related to below.  I will check an echocardiogram.  MR: This was mild to moderate previously.  I will follow up.  She will need an echocardiogram.   Current medicines are reviewed at length with the patient today.  The patient does not have concerns regarding medicines.  The following changes have been made:  None  Labs/ tests ordered today include:    Orders Placed This Encounter  Procedures  . CARDIAC EVENT MONITOR  . EKG 12-Lead  . ECHOCARDIOGRAM COMPLETE    Disposition:   FU with me in 12 months.      Signed, 05/06/19, MD  01/11/2020 8:47 AM    [facility]

## 2020-01-11 ENCOUNTER — Other Ambulatory Visit: Payer: Self-pay

## 2020-01-11 ENCOUNTER — Ambulatory Visit (INDEPENDENT_AMBULATORY_CARE_PROVIDER_SITE_OTHER): Payer: Medicare Other | Admitting: Cardiology

## 2020-01-11 ENCOUNTER — Encounter: Payer: Self-pay | Admitting: Cardiology

## 2020-01-11 VITALS — BP 132/60 | HR 57 | Ht 64.0 in | Wt 214.6 lb

## 2020-01-11 DIAGNOSIS — I4892 Unspecified atrial flutter: Secondary | ICD-10-CM

## 2020-01-11 DIAGNOSIS — I10 Essential (primary) hypertension: Secondary | ICD-10-CM | POA: Diagnosis not present

## 2020-01-11 DIAGNOSIS — I5032 Chronic diastolic (congestive) heart failure: Secondary | ICD-10-CM

## 2020-01-11 DIAGNOSIS — Z7189 Other specified counseling: Secondary | ICD-10-CM

## 2020-01-11 NOTE — Patient Instructions (Signed)
Medication Instructions:  No changes *If you need a refill on your cardiac medications before your next appointment, please call your pharmacy*   Lab Work: Not needed   Testing/Procedures:  will be schedule at [address] Your physician has requested that you have an echocardiogram. Echocardiography is a painless test that uses sound waves to create images of your heart. It provides your doctor with information about the size and shape of your heart and how well your heart's chambers and valves are working. This procedure takes approximately one hour. There are no restrictions for this procedure.  and Your physician has recommended that you wear an event monitor 30 day. Event monitors are medical devices that record the heart's electrical activity. Doctors most often [place] these monitors to diagnose arrhythmias. Arrhythmias are problems with the speed or rhythm of the heartbeat. The monitor is a small, portable device. You can wear one while you do your normal daily activities. This is usually used to diagnose what is causing palpitations/syncope (passing out).   Follow-Up: At [facility], you and your health needs are our priority.  As part of our continuing mission to provide you with exceptional heart care, we have created designated Provider Care Teams.  These Care Teams include your primary Cardiologist (physician) and Advanced Practice Providers (APPs -  Physician Assistants and Nurse Practitioners) who all work together to provide you with the care you need, when you need it.  We recommend signing up for the patient portal called "MyChart".  Sign up information is provided on this After Visit Summary.  MyChart is used to connect with patients for Virtual Visits (Telemedicine).  Patients are able to view lab/test results, encounter notes, upcoming appointments, etc.  Non-urgent messages can be sent to your provider as well.   To learn more about what you can do with  MyChart, go to [email or web address].    Your next appointment:   2 month(s)  The format for your next appointment:   In Person  Provider:   Rollene Rotunda, MD   Other Instructions  see instructions  [employer] Cardiac Event Monitor Instructions Your physician has requested you wear your cardiac event monitor for __30___ days, (1-30). [employer] may call or text to confirm a shipping address. The monitor will be sent to a land address via UPS. [employer] will not ship a monitor to a PO BOX. It typically takes 3-5 days to receive your monitor after it has been enrolled. [employer] will assist with USPS tracking if your package is delayed. The telephone number for [employer] is [phone]. Once you have received your monitor, please review the enclosed instructions. Instruction tutorials can also be viewed under help and settings on the enclosed cell phone. Your monitor has already been registered assigning a specific monitor serial # to you.  Applying the monitor Remove cell phone from case and turn it on. The cell phone works as IT consultant and needs to be within [employer] of you at all times. The cell phone will need to be charged on a daily basis. We recommend you plug the cell phone into the enclosed charger at your bedside table every night.  Monitor batteries: You will receive two monitor batteries labelled #1 and #2. These are your recorders. Plug battery #2 onto the second connection on the enclosed charger. Keep one battery on the charger at all times. This will keep the monitor battery deactivated. It will also keep it fully charged for when you need to switch  your monitor batteries. A small light will be blinking on the battery emblem when it is charging. The light on the battery emblem will remain on when the battery is fully charged.  Open package of a Monitor strip. Insert battery #1 into black hood on strip and gently squeeze monitor battery onto connection  as indicated in instruction booklet. Set aside while preparing skin.  Choose location for your strip, vertical or horizontal, as indicated in the instruction booklet. Shave to remove all hair from location. There cannot be any lotions, oils, powders, or colognes on skin where monitor is to be applied. Wipe skin clean with enclosed Saline wipe. Dry skin completely.  Peel paper labeled #1 off the back of the Monitor strip exposing the adhesive. Place the monitor on the chest in the vertical or horizontal position shown in the instruction booklet. One arrow on the monitor strip must be pointing upward. Carefully remove paper labeled #2, attaching remainder of strip to your skin. Try not to create any folds or wrinkles in the strip as you apply it.  Firmly press and release the circle in the center of the monitor battery. You will hear a small beep. This is turning the monitor battery on. The heart emblem on the monitor battery will light up every 5 seconds if the monitor battery in turned on and connected to the patient securely. Do not push and hold the circle down as this turns the monitor battery off. The cell phone will locate the monitor battery. A screen will appear on the cell phone checking the connection of your monitor strip. This may read poor connection initially but change to good connection within the next minute. Once your monitor accepts the connection you will hear a series of 3 beeps followed by a climbing crescendo of beeps. A screen will appear on the cell phone showing the two monitor strip placement options. Touch the picture that demonstrates where you applied the monitor strip.  Your monitor strip and battery are waterproof. You are able to shower, bathe, or swim with the monitor on. They just ask you do not submerge deeper than 3 feet underwater. We recommend removing the monitor if you are swimming in a lake, river, or ocean.  Your monitor battery will need to be  switched to a fully charged monitor battery approximately once a week. The cell phone will alert you of an action which needs to be made.  On the cell phone, tap for details to reveal connection status, monitor battery status, and cell phone battery status. The green dots indicates your monitor is in good status. A red dot indicates there is something that needs your attention.  To record a symptom, click the circle on the monitor battery. In 30-60 seconds a list of symptoms will appear on the cell phone. Select your symptom and tap save. Your monitor will record a sustained or significant arrhythmia regardless of you clicking the button. Some patients do not feel the heart rhythm irregularities. [employer] will notify us of any serious or critical events.  Refer to instruction booklet for instructions on switching batteries, changing strips, the Do not disturb or Pause features, or any additional questions.  Call [employer] at [phone], to confirm your monitor is transmitting and record your baseline. They will answer any questions you may have regarding the monitor instructions at that time.  Returning the monitor to [employer] Place all equipment back into blue box. Peel off strip of paper to expose adhesive and close box  securely. There is a prepaid UPS shipping label on this box. Drop in a UPS drop box, or at a UPS facility like [employer]. You may also contact [employer] to arrange UPS to pick up monitor package at your home.

## 2020-01-29 ENCOUNTER — Encounter: Payer: Self-pay | Admitting: *Deleted

## 2020-01-29 ENCOUNTER — Telehealth: Payer: Self-pay | Admitting: *Deleted

## 2020-01-29 NOTE — Telephone Encounter (Signed)
Patient inquiring about cardiac event monitor. Cardiac event monitor was linked to her office visit with Dr. Antoine Poche 01/11/2020 , therefore it was never dropped into the [facility] monitor work que.  This has been corrected.  Land address obtained to have [employer] ship a 30 day cardiac event monitor to her home.

## 2020-01-30 ENCOUNTER — Ambulatory Visit (HOSPITAL_COMMUNITY): Payer: Medicare Other | Attending: Cardiology

## 2020-01-30 ENCOUNTER — Other Ambulatory Visit: Payer: Self-pay

## 2020-01-30 DIAGNOSIS — I4892 Unspecified atrial flutter: Secondary | ICD-10-CM

## 2020-01-30 DIAGNOSIS — I5032 Chronic diastolic (congestive) heart failure: Secondary | ICD-10-CM

## 2020-01-30 LAB — ECHOCARDIOGRAM COMPLETE
Area-P 1/2: 2.36 cm2
S' Lateral: 3.1 cm

## 2020-01-31 DIAGNOSIS — E119 Type 2 diabetes mellitus without complications: Secondary | ICD-10-CM | POA: Diagnosis not present

## 2020-02-07 ENCOUNTER — Telehealth: Payer: Self-pay | Admitting: *Deleted

## 2020-02-07 ENCOUNTER — Encounter (INDEPENDENT_AMBULATORY_CARE_PROVIDER_SITE_OTHER): Payer: Medicare Other

## 2020-02-07 DIAGNOSIS — R002 Palpitations: Secondary | ICD-10-CM | POA: Diagnosis not present

## 2020-02-07 DIAGNOSIS — I4892 Unspecified atrial flutter: Secondary | ICD-10-CM | POA: Diagnosis not present

## 2020-02-07 NOTE — Telephone Encounter (Signed)
Confirmed patient has received her 30 day cardiac event monitor from [employer] by UPS.

## 2020-02-28 ENCOUNTER — Ambulatory Visit: Payer: Medicare Other | Admitting: Cardiology

## 2020-03-02 ENCOUNTER — Telehealth (HOSPITAL_COMMUNITY): Payer: Self-pay | Admitting: Cardiology

## 2020-03-02 NOTE — Telephone Encounter (Signed)
Paged by [employer] monitoring company regarding patient's Holter monitor.  Reported for an episode occurring at 12:39 PM at which time the patient was in sinus rhythm and went in to complete heart block with a 3.3-second pause returning to NSR with isolated PACs.  Monitor company attempted to reach patient without success.  I personally called the patient as well with no answer therefore left a voice message to call the office or return a page to the answering service this afternoon.  Georgie Chard NP-C [facility] Pager: [phone]'

## 2020-03-03 ENCOUNTER — Telehealth: Payer: Self-pay

## 2020-03-03 NOTE — Telephone Encounter (Signed)
Received [employer] monitor alert stating pt experienced a 3.3-second perior of 3rd Degree Heart Block midday on Sunday. Spoke with the pt, who was at that time and remains asymptomatic. She did not detect anything was wrong at any point yesterday. Dr. Jacques Navy (DOD) reviewed strip and suggested we send strip to Dr. Elberta Fortis, who has previously followed with pt. Dr. Elberta Fortis reviewed strip and stated we can continue to monitor the pt for now since she is asymptomatic.   Pt instructed to seek urgent evaluation for any chest pain, shortness of breath, dizziness, or loss of consciousness. The patient verbalizes understanding and agreement with plan.

## 2020-03-10 ENCOUNTER — Other Ambulatory Visit: Payer: Self-pay | Admitting: Cardiology

## 2020-03-10 MED ORDER — METOPROLOL SUCCINATE ER 100 MG PO TB24: 100.0000 mg | ORAL_TABLET | Freq: Every day | ORAL | 3 refills | Status: DC

## 2020-03-10 NOTE — Telephone Encounter (Signed)
*  STAT* If patient is at the pharmacy, call can be transferred to refill team.   1. Which medications need to be refilled? (please list name of each medication and dose if known) metoprolol succinate (TOPROL-XL) 100 MG 24 hr tablet [[id number]]   2. Which pharmacy/location (including street and city if local pharmacy) is medication to be sent to  [id number] [place] - [address] [place] [address]  Phone:  [phone] Fax:  [phone]   3. Do they need a 30 day or 90 day supply? 90  Pt has 2 weeks left

## 2020-03-10 NOTE — Telephone Encounter (Signed)
Rx(s) sent to pharmacy electronically.  

## 2020-03-13 DIAGNOSIS — E1169 Type 2 diabetes mellitus with other specified complication: Secondary | ICD-10-CM | POA: Diagnosis not present

## 2020-03-13 DIAGNOSIS — I10 Essential (primary) hypertension: Secondary | ICD-10-CM | POA: Diagnosis not present

## 2020-03-13 DIAGNOSIS — M130 Polyarthritis, unspecified: Secondary | ICD-10-CM | POA: Diagnosis not present

## 2020-03-13 DIAGNOSIS — E069 Thyroiditis, unspecified: Secondary | ICD-10-CM | POA: Diagnosis not present

## 2020-03-19 ENCOUNTER — Other Ambulatory Visit: Payer: Self-pay | Admitting: Cardiology

## 2020-03-19 DIAGNOSIS — I4892 Unspecified atrial flutter: Secondary | ICD-10-CM

## 2020-03-19 DIAGNOSIS — R002 Palpitations: Secondary | ICD-10-CM

## 2020-03-24 DIAGNOSIS — I272 Pulmonary hypertension, unspecified: Secondary | ICD-10-CM | POA: Insufficient documentation

## 2020-03-24 NOTE — Progress Notes (Signed)
Cardiology Office Note   Date:  03/26/2020   ID:  Alexandra Green, DOB 03/25/1941, MRN [id number]  PCP:  [name], MD  Cardiologist:   Rollene Rotunda, MD   Chief Complaint  Patient presents with  . Leg Swelling      History of Present Illness: Alexandra Green is a 79 y.o. female who presents for follow up of atrial flutter after DCCV. She had a TEE cardioversion and was treated with anticoagulation with Eliquis with CHADS VASC score of 4. Other history includes cardiomyopathy, hypertension, and CHF, with diabetes.  She had ablation by Dr. Elberta Fortis.  Because this was thought to be successful she was taken off of Eliquis.    She thought that she was having atrial flutter.  However, I ordered a monitor and she had no evidence of flutter/fib.  She also had an echo which demonstrated moderately elevated pulmonary pressures.     She returns for follow up.    She is not really noticing much in the way of palpitations.  She has a little dizziness if she stands up quickly.  She gets around with a rolling walker.  She denies any chest pressure, neck or arm discomfort.  She has had no new shortness of breath, PND or orthopnea.  She has no palpitations,.  Or syncope.  She has had no weight gain.  Her lower extremity edema is chronic.  Past Medical History:  Diagnosis Date  . Acute on chronic combined systolic and diastolic CHF (congestive heart failure) (HCC)   . Arthritis   . Atrial fibrillation with RVR (HCC) 05/14/2016  . Chronic diastolic HF (heart failure) (HCC) 02/17/2019  . Cough 05/31/2018  . Diabetes mellitus without complication (HCC)   . Educated about COVID-19 virus infection 03/21/2019  . Hypertension   . Leukocytosis 05/15/2016  . New onset atrial fibrillation (HCC) 05/15/2016  . New onset atrial flutter (HCC) 05/14/2016  . Non-ischemic cardiomyopathy (HCC) 08/26/2016    Past Surgical History:  Procedure Laterality Date  . A-FLUTTER ABLATION N/A 05/04/2019   Procedure:  A-FLUTTER ABLATION;  Surgeon: Regan Lemming, MD;  Location: [facility] INVASIVE [facility];  Service: Cardiovascular;  Laterality: N/A;  . ABDOMINAL HYSTERECTOMY    . CARDIOVERSION N/A 05/17/2016   Procedure: CARDIOVERSION;  Surgeon: Jake Bathe, MD;  [facility] ENDOSCOPY;  Service: Cardiovascular;  Laterality: N/A;  . CATARACT EXTRACTION    . CHOLECYSTECTOMY    . TEE WITHOUT CARDIOVERSION N/A 05/17/2016   Procedure: TRANSESOPHAGEAL ECHOCARDIOGRAM (TEE);  Surgeon: Jake Bathe, MD;  Location: [facility] ENDOSCOPY;  Service: Cardiovascular;  Laterality: N/A;  . TOTAL KNEE ARTHROPLASTY Left      Current Outpatient Medications  Medication Sig Dispense Refill  . glimepiride (AMARYL) 4 MG tablet Take 4 mg by mouth daily with breakfast.    . losartan-hydrochlorothiazide (HYZAAR) 100-25 MG tablet Take 1 tablet by mouth daily.    . metFORMIN (GLUCOPHAGE) 500 MG tablet Take 2,000 mg by mouth at bedtime.     . metoprolol succinate (TOPROL-XL) 100 MG 24 hr tablet Take 1 tablet (100 mg total) by mouth daily. Take with or immediately following a meal. 90 tablet 3  . Multiple Vitamin (MULTIVITAMIN WITH MINERALS) TABS tablet Take 1 tablet by mouth daily.     . NONFORMULARY OR COMPOUNDED ITEM Apply 1 application topically 4 (four) times daily as needed (right knee pain.). 3%Diclofenac-2%Baclofen-5%Gabapentin-5%Lidocaine-1%Menthol (apply liberally up to 4 times daily for affected joint)    . Omega-3 Fatty Acids (FISH  OIL) 1200 MG CAPS Take 1,200 mg by mouth 2 (two) times daily.    [name] ULTRA test strip     . hydrALAZINE (APRESOLINE) 25 MG tablet Take 1 tablet (25 mg total) by mouth 2 (two) times daily. 180 tablet 3   No current facility-administered medications for this visit.    Allergies:   Patient has no known allergies.    ROS:  Please see the history of present illness.   Otherwise, review of systems are positive for none.   All other systems are reviewed and negative.    PHYSICAL EXAM: VS:  BP (!)  166/82 (BP Location: Right Arm, Patient Position: Sitting)   Pulse (!) 54   Ht 5\' 4"  (1.626 m)   Wt 212 lb (96.2 kg)   BMI 36.39 kg/m  , BMI Body mass index is 36.39 kg/m. GEN:  No distress NECK:  No jugular venous distention at 90 degrees, waveform within normal limits, carotid upstroke brisk and symmetric, no bruits, no thyromegaly LYMPHATICS:  No cervical adenopathy LUNGS:  Clear to auscultation bilaterally BACK:  No CVA tenderness CHEST:  Unremarkable HEART:  S1 and S2 within normal limits, no S3, no S4, no clicks, no rubs, 3 out of 6 axillary holosystolic murmur, no diastolic murmurs ABD:  Positive bowel sounds normal in frequency in pitch, no bruits, no rebound, no guarding, unable to assess midline mass or bruit with the patient seated. EXT:  2 plus pulses throughout, mild bilateral lower extremity edema, no cyanosis no clubbing SKIN:  No rashes no nodules NEURO:  Cranial nerves II through XII grossly intact, motor grossly intact throughout PSYCH:  Cognitively intact, oriented to person place and time  EKG:  EKG is not ordered today.   Recent Labs: 05/04/2019: BUN 12; Creatinine, Ser 0.96; Hemoglobin 13.5; Platelets 290; Potassium 3.0; Sodium 143    Lipid Panel No results found for: CHOL, TRIG, HDL, CHOLHDL, VLDL, LDLCALC, LDLDIRECT    Wt Readings from Last 3 Encounters:  03/26/20 212 lb (96.2 kg)  01/11/20 214 lb 9.6 oz (97.3 kg)  06/11/19 222 lb (100.7 kg)      Other studies Reviewed: Additional studies/ records that were reviewed today include: Labs. Review of the above records demonstrates: See elsewhere  ASSESSMENT AND PLAN:  Hypertension:  Her blood pressures are not at target.  I am going to increase her hydralazine to 25 mg twice daily.   Atrial flutter.   I did not see evidence of fib or flutter.  She is not feeling any further A. fib or flutter.  She did not have any evidence of this on the monitor.  She still has her Alive Cor.  Chronic diastolic and  systolic CHF:    She seems to be euvolemic.  No change in therapy.   Cardiomyopathy: She has had a mildly reduced ejection fraction 40 to 45%.   However, this is better at 655% on the last echo.  No change in therapy.   MR:   This was mild on echo in October 2021.  I will follow this clinically.   PULMONARY HTN:  This was moderate on echo.  However, she really does not have a lot of symptoms related to this.  I suspect this is related to some diastolic dysfunction with left ventricular hypertrophy related to hypertension.  I will continue to manage her hypertension aggressively.   Current medicines are reviewed at length with the patient today.  The patient does not have concerns regarding medicines.  The following changes have been made: As above  Labs/ tests ordered today include:    No orders of the defined types were placed in this encounter.   Disposition:   FU with me in 6 months.      Signed, Rollene Rotunda, MD  03/26/2020 10:12 AM    [facility]     Cardiology Office Note   Date:  03/26/2020   ID:  Alexandra Green, DOB 1941-04-02, MRN [id number]  PCP:  [name], MD  Cardiologist:   Rollene Rotunda, MD   Chief Complaint  Patient presents with  . Leg Swelling      History of Present Illness: Alexandra Green is a 79 y.o. female who presents for follow up of atrial flutter after DCCV. She had a TEE cardioversion and was treated with anticoagulation with Eliquis with CHADS VASC score of 4. Other history includes cardiomyopathy, hypertension, and CHF, with diabetes.    Since I last saw her she had ablation by Dr. Elberta Fortis.  Because this was thought to be successful she was taken off of Eliquis.  However, she has had Alive Cor and she thinks that she has been out of rhythm although when she describes it it might be PACs rather than flutter.  She might feel the palpitations.  She does not have any rapid rates.  She does not have any  presyncope or syncope.  She is fatigued.  She is mostly limited and walks slowly with a walker because of back and joint problems.   Past Medical History:  Diagnosis Date  . Acute on chronic combined systolic and diastolic CHF (congestive heart failure) (HCC)   . Arthritis   . Atrial fibrillation with RVR (HCC) 05/14/2016  . Chronic diastolic HF (heart failure) (HCC) 02/17/2019  . Cough 05/31/2018  . Diabetes mellitus without complication (HCC)   . Educated about COVID-19 virus infection 03/21/2019  . Hypertension   . Leukocytosis 05/15/2016  . New onset atrial fibrillation (HCC) 05/15/2016  . New onset atrial flutter (HCC) 05/14/2016  . Non-ischemic cardiomyopathy (HCC) 08/26/2016    Past Surgical History:  Procedure Laterality Date  . A-FLUTTER ABLATION N/A 05/04/2019   Procedure: A-FLUTTER ABLATION;  Surgeon: Regan Lemming, MD;  Location: [facility]VASIVE [facility] LAB;  Service: Cardiovascular;  Laterality: N/A;  . ABDOMINAL HYSTERECTOMY    . CARDIOVERSION N/A 05/17/2016   Procedure: CARDIOVERSION;  Surgeon: Jake Bathe, MD;  Location: [facility] ENDOSCOPY;  Service: Cardiovascular;  Laterality: N/A;  . CATARACT EXTRACTION    . CHOLECYSTECTOMY    . TEE WITHOUT CARDIOVERSION N/A 05/17/2016   Procedure: TRANSESOPHAGEAL ECHOCARDIOGRAM (TEE);  Surgeon: Jake Bathe, MD;  Location: [facility] ENDOSCOPY;  Service: Cardiovascular;  Laterality: N/A;  . TOTAL KNEE ARTHROPLASTY Left      Current Outpatient Medications  Medication Sig Dispense Refill  . glimepiride (AMARYL) 4 MG tablet Take 4 mg by mouth daily with breakfast.    . losartan-hydrochlorothiazide (HYZAAR) 100-25 MG tablet Take 1 tablet by mouth daily.    . metFORMIN (GLUCOPHAGE) 500 MG tablet Take 2,000 mg by mouth at bedtime.     . metoprolol succinate (TOPROL-XL) 100 MG 24 hr tablet Take 1 tablet (100 mg total) by mouth daily. Take with or immediately following a meal. 90 tablet 3  . Multiple Vitamin (MULTIVITAMIN WITH MINERALS) TABS tablet Take 1  tablet by mouth daily.     . NONFORMULARY OR COMPOUNDED ITEM Apply 1 application topically 4 (four) times daily as needed (  right knee pain.). 3%Diclofenac-2%Baclofen-5%Gabapentin-5%Lidocaine-1%Menthol (apply liberally up to 4 times daily for affected joint)    . Omega-3 Fatty Acids (FISH OIL) 1200 MG CAPS Take 1,200 mg by mouth 2 (two) times daily.    [name] ULTRA test strip     . hydrALAZINE (APRESOLINE) 25 MG tablet Take 1 tablet (25 mg total) by mouth 2 (two) times daily. 180 tablet 3   No current facility-administered medications for this visit.    Allergies:   Patient has no known allergies.    ROS:  Please see the history of present illness.   Otherwise, review of systems are positive for none.   All other systems are reviewed and negative.    PHYSICAL EXAM: VS:  BP (!) 166/82 (BP Location: Right Arm, Patient Position: Sitting)   Pulse (!) 54   Ht 5\' 4"  (1.626 m)   Wt 212 lb (96.2 kg)   BMI 36.39 kg/m  , BMI Body mass index is 36.39 kg/m. GEN:  No distress NECK:  No jugular venous distention at 90 degrees, waveform within normal limits, carotid upstroke brisk and symmetric, no bruits, no thyromegaly LYMPHATICS:  No cervical adenopathy LUNGS:  Clear to auscultation bilaterally BACK:  No CVA tenderness CHEST:  Unremarkable HEART:  S1 and S2 within normal limits, no S3, no S4, no clicks, no rubs, 3 out of 6 apical systolic murmur radiating up uric outflow tract, no diastolic murmurs ABD:  Positive bowel sounds normal in frequency in pitch, no bruits, no rebound, no guarding, unable to assess midline mass or bruit with the patient seated. EXT:  2 plus pulses throughout, no edema, no cyanosis no clubbing SKIN:  No rashes no nodules NEURO:  Cranial nerves II through XII grossly intact, motor grossly intact throughout PSYCH:  Cognitively intact, oriented to person place and time  EKG:  EKG is ordered today. The ekg ordered today demonstrates atrial flutter, rate 57, left axis  deviation, variable conduction, poor anterior R wave progression.  PACs   Recent Labs: 05/04/2019: BUN 12; Creatinine, Ser 0.96; Hemoglobin 13.5; Platelets 290; Potassium 3.0; Sodium 143    Lipid Panel No results found for: CHOL, TRIG, HDL, CHOLHDL, VLDL, LDLCALC, LDLDIRECT    Wt Readings from Last 3 Encounters:  03/26/20 212 lb (96.2 kg)  01/11/20 214 lb 9.6 oz (97.3 kg)  06/11/19 222 lb (100.7 kg)      Other studies Reviewed: Additional studies/ records that were reviewed today include: None. Review of the above records demonstrates: See elsewhere  ASSESSMENT AND PLAN:  Hypertension:  Her blood pressures are at target.  No change in therapy.   Atrial flutter.   She is in sinus rhythm today.  However, I am going to apply a Holter monitor to make sure she is not having any flutter.  I am also asking her to see if his family members can help her send me some recordings from her Alive Cor.    Chronic diastolic and systolic CHF:    She seems to be euvolemic.  No change in therapy.  Cardiomyopathy: She has had a mildly reduced ejection fraction 40 to 45%.  She had some moderate concentric hypertrophy.  She also has a murmur that sounds more prominent than previous.  This is likely related to below.  I will check an echocardiogram.  MR: This was mild to moderate previously.  I will follow up.  She will need an echocardiogram.   Current medicines are reviewed at length with the patient today.  The patient does not have concerns regarding medicines.  The following changes have been made:  None  Labs/ tests ordered today include:    No orders of the defined types were placed in this encounter.   Disposition:   FU with me in 12 months.      Signed, Rollene Rotunda, MD  03/26/2020 10:12 AM    [facility]

## 2020-03-26 ENCOUNTER — Ambulatory Visit: Payer: Medicare Other | Admitting: Cardiology

## 2020-03-26 ENCOUNTER — Other Ambulatory Visit: Payer: Self-pay

## 2020-03-26 ENCOUNTER — Encounter: Payer: Self-pay | Admitting: Cardiology

## 2020-03-26 VITALS — BP 166/82 | HR 54 | Ht 64.0 in | Wt 212.0 lb

## 2020-03-26 DIAGNOSIS — I272 Pulmonary hypertension, unspecified: Secondary | ICD-10-CM

## 2020-03-26 DIAGNOSIS — I5042 Chronic combined systolic (congestive) and diastolic (congestive) heart failure: Secondary | ICD-10-CM

## 2020-03-26 DIAGNOSIS — I10 Essential (primary) hypertension: Secondary | ICD-10-CM

## 2020-03-26 DIAGNOSIS — I4891 Unspecified atrial fibrillation: Secondary | ICD-10-CM | POA: Diagnosis not present

## 2020-03-26 MED ORDER — HYDRALAZINE HCL 25 MG PO TABS: 25.0000 mg | ORAL_TABLET | Freq: Two times a day (BID) | ORAL | 3 refills | Status: DC

## 2020-03-26 NOTE — Patient Instructions (Signed)
Medication Instructions:  INCREASE HYDRALAZINE TO 25MG  TWICE A DAY *If you need a refill on your cardiac medications before your next appointment, please call your pharmacy*  Lab Work: NONE ORDERED THIS VISIT  Testing/Procedures: NONE ORDERED THIS VISIT  Follow-Up: At [facility], you and your health needs are our priority.  As part of our continuing mission to provide you with exceptional heart care, we have created designated Provider Care Teams.  These Care Teams include your primary Cardiologist (physician) and Advanced Practice Providers (APPs -  Physician Assistants and Nurse Practitioners) who all work together to provide you with the care you need, when you need it.  Your next appointment:   6 month(s)  You will receive a reminder letter in the mail two months in advance. If you don't receive a letter, please call our office to schedule the follow-up appointment.  The format for your next appointment:   In Person  Provider:   [facility], MD

## 2020-05-10 DIAGNOSIS — E1169 Type 2 diabetes mellitus with other specified complication: Secondary | ICD-10-CM | POA: Diagnosis not present

## 2020-05-10 DIAGNOSIS — I10 Essential (primary) hypertension: Secondary | ICD-10-CM | POA: Diagnosis not present

## 2020-05-10 DIAGNOSIS — I480 Paroxysmal atrial fibrillation: Secondary | ICD-10-CM | POA: Diagnosis not present

## 2020-05-29 ENCOUNTER — Ambulatory Visit: Payer: Medicare Other | Admitting: Sports Medicine

## 2020-05-29 ENCOUNTER — Other Ambulatory Visit: Payer: Self-pay

## 2020-05-29 ENCOUNTER — Encounter: Payer: Self-pay | Admitting: Sports Medicine

## 2020-05-29 DIAGNOSIS — M79674 Pain in right toe(s): Secondary | ICD-10-CM

## 2020-05-29 DIAGNOSIS — E1142 Type 2 diabetes mellitus with diabetic polyneuropathy: Secondary | ICD-10-CM | POA: Diagnosis not present

## 2020-05-29 DIAGNOSIS — S90421A Blister (nonthermal), right great toe, initial encounter: Secondary | ICD-10-CM | POA: Diagnosis not present

## 2020-05-29 DIAGNOSIS — K5900 Constipation, unspecified: Secondary | ICD-10-CM | POA: Insufficient documentation

## 2020-05-29 DIAGNOSIS — M79675 Pain in left toe(s): Secondary | ICD-10-CM | POA: Diagnosis not present

## 2020-05-29 DIAGNOSIS — B351 Tinea unguium: Secondary | ICD-10-CM

## 2020-05-29 DIAGNOSIS — I739 Peripheral vascular disease, unspecified: Secondary | ICD-10-CM | POA: Diagnosis not present

## 2020-05-29 DIAGNOSIS — R634 Abnormal weight loss: Secondary | ICD-10-CM | POA: Insufficient documentation

## 2020-05-29 DIAGNOSIS — Z1211 Encounter for screening for malignant neoplasm of colon: Secondary | ICD-10-CM | POA: Insufficient documentation

## 2020-05-29 NOTE — Progress Notes (Signed)
Subjective: Alexandra Green is a 80 y.o. female patient with history of diabetes who presents to office today complaining of long,mildly painful nails  while ambulating in shoes; unable to trim. Reports that she has some skin on right 1st toe that was red. Patient states that the glucose reading this morning was not recorded. Patient denies any new changes in medication or new problems. Reports no longer on eliquis after abalation procedure.  Review of Systems  All other systems reviewed and are negative.    Patient Active Problem List   Diagnosis Date Noted  . Abnormal weight loss 05/29/2020  . Colon cancer screening 05/29/2020  . Constipation 05/29/2020  . Morbid obesity (HCC) 05/29/2020  . Pulmonary HTN (HCC) 03/24/2020  . Educated about COVID-19 virus infection 03/21/2019  . Chronic diastolic HF (heart failure) (HCC) 02/17/2019  . Cough 05/31/2018  . Hypertension   . Arthritis   . Non-ischemic cardiomyopathy (HCC) 08/26/2016  . Acute on chronic combined systolic and diastolic CHF (congestive heart failure) (HCC)   . New onset atrial fibrillation (HCC) 05/15/2016  . Leukocytosis 05/15/2016  . Essential hypertension   . Diabetes mellitus without complication (HCC)   . Atrial fibrillation with RVR (HCC) 05/14/2016  . New onset atrial flutter (HCC) 05/14/2016   Current Outpatient Medications on File Prior to Visit  Medication Sig Dispense Refill  . Carvedilol (COREG PO)     . glimepiride (AMARYL) 4 MG tablet Take 4 mg by mouth daily with breakfast.    . hydrALAZINE (APRESOLINE) 25 MG tablet Take 1 tablet (25 mg total) by mouth 2 (two) times daily. 180 tablet 3  . hydrochlorothiazide (HYDRODIURIL) 25 MG tablet Take 25 mg by mouth every morning.    . Lisinopril-hydroCHLOROthiazide (PRINZIDE PO)     . losartan (COZAAR) 100 MG tablet Take 100 mg by mouth every morning.    Marland Kitchen losartan-hydrochlorothiazide (HYZAAR) 100-25 MG tablet Take 1 tablet by mouth daily.    . metFORMIN  (GLUCOPHAGE) 500 MG tablet Take 2,000 mg by mouth at bedtime.     . metoprolol succinate (TOPROL-XL) 100 MG 24 hr tablet Take 1 tablet (100 mg total) by mouth daily. Take with or immediately following a meal. 90 tablet 3  . Multiple Vitamin (MULTIVITAMIN WITH MINERALS) TABS tablet Take 1 tablet by mouth daily.     . NONFORMULARY OR COMPOUNDED ITEM Apply 1 application topically 4 (four) times daily as needed (right knee pain.). 3%Diclofenac-2%Baclofen-5%Gabapentin-5%Lidocaine-1%Menthol (apply liberally up to 4 times daily for affected joint)    . Omega-3 Fatty Acids (FISH OIL) 1200 MG CAPS Take 1,200 mg by mouth 2 (two) times daily.    [name] ULTRA test strip      No current facility-administered medications on file prior to visit.   No Known Allergies  No results found for this or any previous visit (from the past 2160 hour(s)).  Objective: General: Patient is awake, alert, and oriented x 3 and in no acute distress.  Integument: Skin is warm, dry and supple bilateral. Nails are tender, long, thickened and  dystrophic with subungual debris, consistent with onychomycosis, 1-5 bilateral. Dry skin at right great toe at area of previous blister. No signs of infection. Minimal corn to bil 5th toes, No open lesions or preulcerative lesions present bilateral. Remaining integument unremarkable.  Vasculature:  Dorsalis Pedis pulse 1/4 bilateral. Posterior Tibial pulse  0/4 bilateral.  Capillary fill time <3 sec 1-5 bilateral. No hair growth to the level of the digits. Temperature gradient within normal  limits. No varicosities present bilateral. Trace edema present bilateral.   Neurology: The patient has absent sensation measured with a 5.07/10g Semmes Weinstein Monofilament at all pedal sites bilateral. Vibratory sensation absent bilateral with tuning fork. No Babinski sign present bilateral.   Musculoskeletal: Asymptomatic pes planus and bunion pedal deformities noted bilateral. Muscular strength  5/5 in all lower extremity muscular groups bilateral without pain on range of motion . No tenderness with calf compression bilateral.  Assessment and Plan: Problem List Items Addressed This Visit   None   Visit Diagnoses    Pain due to onychomycosis of toenails of both feet    -  Primary   Diabetic polyneuropathy associated with type 2 diabetes mellitus (HCC)       Relevant Medications   losartan (COZAAR) 100 MG tablet   Lisinopril-hydroCHLOROthiazide (PRINZIDE PO)   PVD (peripheral vascular disease) (HCC)       Relevant Medications   hydrochlorothiazide (HYDRODIURIL) 25 MG tablet   losartan (COZAAR) 100 MG tablet   Lisinopril-hydroCHLOROthiazide (PRINZIDE PO)   Carvedilol (COREG PO)   Blister of great toe of right foot, initial encounter       Dry      -Examined patient. -Discussed and educated patient on diabetic foot care, especially with  regards to the vascular, neurological and musculoskeletal systems.  -Stressed the importance of good glycemic control and the detriment of not  controlling glucose levels in relation to the foot. -Mechanically debrided all nails 1-5 bilateral using sterile nail nipper and filed with dremel without incident  -Dry blood blister to right 1st toe one debrided with 15 blade loose skin with no underlying opening noted -Answered all patient questions -Patient to return  in 3 months for at risk foot care -Patient advised to call the office if any problems or questions arise in the meantime.  Asencion Islam, DPM

## 2020-06-09 ENCOUNTER — Ambulatory Visit: Payer: Self-pay | Admitting: Internal Medicine

## 2020-06-12 ENCOUNTER — Other Ambulatory Visit: Payer: Self-pay

## 2020-06-12 ENCOUNTER — Other Ambulatory Visit (HOSPITAL_COMMUNITY): Payer: Self-pay | Admitting: Family Medicine

## 2020-06-12 ENCOUNTER — Ambulatory Visit (HOSPITAL_COMMUNITY)
Admission: RE | Admit: 2020-06-12 | Discharge: 2020-06-12 | Disposition: A | Discharge status: Home / Self Care | Payer: Medicare Other | Source: Ambulatory Visit | Attending: Family Medicine | Admitting: Family Medicine

## 2020-06-12 DIAGNOSIS — R600 Localized edema: Secondary | ICD-10-CM

## 2020-06-12 DIAGNOSIS — M79604 Pain in right leg: Secondary | ICD-10-CM | POA: Diagnosis not present

## 2020-06-12 DIAGNOSIS — I739 Peripheral vascular disease, unspecified: Secondary | ICD-10-CM | POA: Diagnosis not present

## 2020-06-12 DIAGNOSIS — I10 Essential (primary) hypertension: Secondary | ICD-10-CM | POA: Diagnosis not present

## 2020-06-12 DIAGNOSIS — M79605 Pain in left leg: Secondary | ICD-10-CM | POA: Diagnosis not present

## 2020-06-12 DIAGNOSIS — E1169 Type 2 diabetes mellitus with other specified complication: Secondary | ICD-10-CM | POA: Diagnosis not present

## 2020-06-12 DIAGNOSIS — I480 Paroxysmal atrial fibrillation: Secondary | ICD-10-CM | POA: Diagnosis not present

## 2020-06-12 NOTE — Progress Notes (Signed)
Lower extremity venous has been completed.   Preliminary results in CV Proc.   Blanch Media 06/12/2020 2:04 PM

## 2020-08-11 ENCOUNTER — Other Ambulatory Visit: Payer: Self-pay

## 2020-08-11 DIAGNOSIS — I739 Peripheral vascular disease, unspecified: Secondary | ICD-10-CM

## 2020-08-20 ENCOUNTER — Encounter: Payer: Self-pay | Admitting: Vascular Surgery

## 2020-08-20 ENCOUNTER — Ambulatory Visit (INDEPENDENT_AMBULATORY_CARE_PROVIDER_SITE_OTHER)
Admission: RE | Admit: 2020-08-20 | Discharge: 2020-08-20 | Disposition: A | Discharge status: Home / Self Care | Payer: Medicare Other | Source: Ambulatory Visit | Attending: Vascular Surgery | Admitting: Vascular Surgery

## 2020-08-20 ENCOUNTER — Encounter (HOSPITAL_COMMUNITY): Payer: Self-pay

## 2020-08-20 ENCOUNTER — Other Ambulatory Visit: Payer: Self-pay

## 2020-08-20 ENCOUNTER — Ambulatory Visit (HOSPITAL_COMMUNITY)
Admission: RE | Admit: 2020-08-20 | Discharge: 2020-08-20 | Disposition: A | Discharge status: Home / Self Care | Payer: Medicare Other | Source: Ambulatory Visit | Attending: Vascular Surgery | Admitting: Vascular Surgery

## 2020-08-20 ENCOUNTER — Ambulatory Visit: Payer: Medicare Other | Admitting: Vascular Surgery

## 2020-08-20 VITALS — BP 127/68 | HR 58 | Temp 98.3°F | Resp 20 | Ht 64.0 in | Wt 205.0 lb

## 2020-08-20 DIAGNOSIS — I739 Peripheral vascular disease, unspecified: Secondary | ICD-10-CM | POA: Diagnosis not present

## 2020-08-20 DIAGNOSIS — I872 Venous insufficiency (chronic) (peripheral): Secondary | ICD-10-CM | POA: Diagnosis not present

## 2020-08-20 DIAGNOSIS — M7989 Other specified soft tissue disorders: Secondary | ICD-10-CM

## 2020-08-20 NOTE — Progress Notes (Signed)
REASON FOR CONSULT:    Peripheral vascular disease.  The consult is requested by Dr. Parke Simmers.  ASSESSMENT & PLAN:   CHRONIC VENOUS INSUFFICIENCY: Based on her exam and history I think she does likely have some underlying chronic venous insufficiency.  Her symptoms feel better when she elevates her legs.  She appears to have some venous congestion.  We have discussed the importance of intermittent leg elevation and the proper positioning for this.  We have fitted her for some knee-high compression stockings with a gradient of 15 to 20 mmHg.  I have encouraged her to avoid prolonged sitting and standing.  We also discussed importance of exercise specifically walking and water aerobics.  Maintaining a healthy weight is also important as central obesity especially increases lower extremity venous pressure.  I reassured her that I do not think she had any evidence of significant peripheral vascular disease.  She had normal waveforms in both feet and normal toe pressures.  I will be happy to see her back anytime if any new vascular issues arise.  Waverly Ferrari, MD Office: [phone]   HPI:   Alexandra Green is a pleasant 80 y.o. female, who is referred for evaluation of peripheral vascular disease.  I have reviewed the records from the referring office.  The patient was seen on 06/12/2020 with blisters on the feet.  She had some lesions on her great toe especially on the right foot.  Her feet were noted to be somewhat cool and had some purpleish discoloration.  For this reason she was sent for peripheral vascular disease evaluation.  On my history I do not get any history of claudication although her activity is fairly limited.  She is ambulatory with a walker.  She denies any history of rest pain.  She occasionally gets some aching pain in her legs which is alleviated with elevation which would suggest some underlying venous disease.  She is had no previous history of DVT.  I do not get any  history of nonhealing ulcers although she did have a blister on her toe which is resolved.  Past Medical History:  Diagnosis Date  . Acute on chronic combined systolic and diastolic CHF (congestive heart failure) (HCC)   . Arthritis   . Atrial fibrillation with RVR (HCC) 05/14/2016  . Chronic diastolic HF (heart failure) (HCC) 02/17/2019  . Cough 05/31/2018  . Diabetes mellitus without complication (HCC)   . Educated about COVID-19 virus infection 03/21/2019  . Hypertension   . Leukocytosis 05/15/2016  . New onset atrial fibrillation (HCC) 05/15/2016  . New onset atrial flutter (HCC) 05/14/2016  . Non-ischemic cardiomyopathy (HCC) 08/26/2016    Family History  Problem Relation Age of Onset  . Heart failure Mother 43  . Cirrhosis Father   . Heart attack Son 45  . Atrial fibrillation Neg Hx     SOCIAL HISTORY: Social History   Socioeconomic History  . Marital status: Widowed    Spouse name: Not on file  . Number of children: 2  . Years of education: Not on file  . Highest education level: Not on file  Occupational History  . Not on file  Tobacco Use  . Smoking status: Never Smoker  . Smokeless tobacco: Never Used  Vaping Use  . Vaping Use: Never used  Substance and Sexual Activity  . Alcohol use: No  . Drug use: No  . Sexual activity: Not on file  Other Topics Concern  . Not on file  Social History  Narrative  . Not on file   Social Determinants of Health   Financial Resource Strain: Not on file  Food Insecurity: Not on file  Transportation Needs: Not on file  Physical Activity: Not on file  Stress: Not on file  Social Connections: Not on file  Intimate Partner Violence: Not on file    No Known Allergies  Current Outpatient Medications  Medication Sig Dispense Refill  . Carvedilol (COREG PO)     . glimepiride (AMARYL) 4 MG tablet Take 4 mg by mouth daily with breakfast.    . hydrALAZINE (APRESOLINE) 25 MG tablet Take 1 tablet (25 mg total) by mouth 2 (two) times  daily. 180 tablet 3  . hydrochlorothiazide (HYDRODIURIL) 25 MG tablet Take 25 mg by mouth every morning.    . Lisinopril-hydroCHLOROthiazide (PRINZIDE PO)     . losartan (COZAAR) 100 MG tablet Take 100 mg by mouth every morning.    Marland Kitchen losartan-hydrochlorothiazide (HYZAAR) 100-25 MG tablet Take 1 tablet by mouth daily.    . metFORMIN (GLUCOPHAGE) 500 MG tablet Take 2,000 mg by mouth at bedtime.     . metoprolol succinate (TOPROL-XL) 100 MG 24 hr tablet Take 1 tablet (100 mg total) by mouth daily. Take with or immediately following a meal. 90 tablet 3  . Multiple Vitamin (MULTIVITAMIN WITH MINERALS) TABS tablet Take 1 tablet by mouth daily.     . NONFORMULARY OR COMPOUNDED ITEM Apply 1 application topically 4 (four) times daily as needed (right knee pain.). 3%Diclofenac-2%Baclofen-5%Gabapentin-5%Lidocaine-1%Menthol (apply liberally up to 4 times daily for affected joint)    . Omega-3 Fatty Acids (FISH OIL) 1200 MG CAPS Take 1,200 mg by mouth 2 (two) times daily.    [name] ULTRA test strip      No current facility-administered medications for this visit.    REVIEW OF SYSTEMS:  [X]  denotes positive finding, [ ]  denotes negative finding Cardiac  Comments:  Chest pain or chest pressure:    Shortness of breath upon exertion:    Short of breath when lying flat:    Irregular heart rhythm: x       Vascular    Pain in calf, thigh, or hip brought on by ambulation: x   Pain in feet at night that wakes you up from your sleep:  x   Blood clot in your veins:    Leg swelling:  x       Pulmonary    Oxygen at home:    Productive cough:     Wheezing:         Neurologic    Sudden weakness in arms or legs:     Sudden numbness in arms or legs:     Sudden onset of difficulty speaking or slurred speech: x   Temporary loss of vision in one eye:     Problems with dizziness:         Gastrointestinal    Blood in stool:     Vomited blood:         Genitourinary    Burning when urinating:     Blood  in urine:        Psychiatric    Major depression:         Hematologic    Bleeding problems:    Problems with blood clotting too easily:        Skin    Rashes or ulcers:        Constitutional    Fever or chills:  PHYSICAL EXAM:   Vitals:   08/20/20 0929  BP: 127/68  Pulse: (!) 58  Resp: 20  Temp: 98.3 F (36.8 C)  SpO2: 97%  Weight: 205 lb (93 kg)  Height: 5\' 4"  (1.626 m)    GENERAL: The patient is a well-nourished female, in no acute distress. The vital signs are documented above. CARDIAC: There is a regular rate and rhythm.  VASCULAR: I do not detect carotid bruits. She has palpable femoral pulses.  She has diminished but palpable popliteal pulses.  I cannot palpate pedal pulses because of some leg swelling however the feet are warm and adequately perfused. PULMONARY: There is good air exchange bilaterally without wheezing or rales. ABDOMEN: Soft and non-tender with normal pitched bowel sounds.  MUSCULOSKELETAL: There are no major deformities or cyanosis. NEUROLOGIC: No focal weakness or paresthesias are detected. SKIN: There are no ulcers or rashes noted. PSYCHIATRIC: The patient has a normal affect.  DATA:    ARTERIAL DOPPLER STUDY: I have independently interpreted her arterial Doppler study.  On the right side there is a triphasic dorsalis pedis and posterior tibial signal.  ABIs greater than 100%.  Toe pressures 119 mmHg.  On the left side there is a biphasic dorsalis pedis and posterior tibial signal.  ABIs greater than 100%.  Toe pressures 113 mmHg.  VENOUS DUPLEX: I did review the venous duplex scan that was done on 06/12/2020.  There was no evidence of DVT in either lower extremity and no evidence of superficial venous thrombosis identified.

## 2020-08-28 ENCOUNTER — Ambulatory Visit: Payer: Medicare Other | Admitting: Sports Medicine

## 2020-08-28 ENCOUNTER — Other Ambulatory Visit: Payer: Self-pay

## 2020-08-28 ENCOUNTER — Encounter: Payer: Self-pay | Admitting: Sports Medicine

## 2020-08-28 DIAGNOSIS — M79674 Pain in right toe(s): Secondary | ICD-10-CM | POA: Diagnosis not present

## 2020-08-28 DIAGNOSIS — B351 Tinea unguium: Secondary | ICD-10-CM | POA: Diagnosis not present

## 2020-08-28 DIAGNOSIS — M79675 Pain in left toe(s): Secondary | ICD-10-CM

## 2020-08-28 DIAGNOSIS — E1142 Type 2 diabetes mellitus with diabetic polyneuropathy: Secondary | ICD-10-CM

## 2020-08-28 DIAGNOSIS — I739 Peripheral vascular disease, unspecified: Secondary | ICD-10-CM

## 2020-08-28 NOTE — Progress Notes (Signed)
Subjective: Alexandra Green is a 80 y.o. female patient with history of diabetes who presents to office today complaining of long,mildly painful nails  while ambulating in shoes; unable to trim. Reports that she has some skin on right 1st toe that was red/ blister but now better. Patient states that the glucose reading this morning was not recorded.  Last A1c 6.3  Last visit to PCP December Last visit to vascular doctor 1 week ago  Patient Active Problem List   Diagnosis Date Noted  . Abnormal weight loss 05/29/2020  . Colon cancer screening 05/29/2020  . Constipation 05/29/2020  . Morbid obesity (HCC) 05/29/2020  . Pulmonary HTN (HCC) 03/24/2020  . Educated about COVID-19 virus infection 03/21/2019  . Chronic diastolic HF (heart failure) (HCC) 02/17/2019  . Cough 05/31/2018  . Hypertension   . Arthritis   . Non-ischemic cardiomyopathy (HCC) 08/26/2016  . Acute on chronic combined systolic and diastolic CHF (congestive heart failure) (HCC)   . New onset atrial fibrillation (HCC) 05/15/2016  . Leukocytosis 05/15/2016  . Essential hypertension   . Diabetes mellitus without complication (HCC)   . Atrial fibrillation with RVR (HCC) 05/14/2016  . New onset atrial flutter (HCC) 05/14/2016   Current Outpatient Medications on File Prior to Visit  Medication Sig Dispense Refill  . Carvedilol (COREG PO)     . glimepiride (AMARYL) 4 MG tablet Take 4 mg by mouth daily with breakfast.    . hydrALAZINE (APRESOLINE) 10 MG tablet     . hydrALAZINE (APRESOLINE) 25 MG tablet Take 1 tablet (25 mg total) by mouth 2 (two) times daily. 180 tablet 3  . hydrochlorothiazide (HYDRODIURIL) 25 MG tablet Take 25 mg by mouth every morning.    . Lisinopril-hydroCHLOROthiazide (PRINZIDE PO)     . losartan (COZAAR) 100 MG tablet Take 100 mg by mouth every morning.    Marland Kitchen losartan-hydrochlorothiazide (HYZAAR) 100-25 MG tablet Take 1 tablet by mouth daily.    . metFORMIN (GLUCOPHAGE) 500 MG tablet Take 2,000  mg by mouth at bedtime.     . metoprolol succinate (TOPROL-XL) 100 MG 24 hr tablet Take 1 tablet (100 mg total) by mouth daily. Take with or immediately following a meal. 90 tablet 3  . Multiple Vitamin (MULTIVITAMIN WITH MINERALS) TABS tablet Take 1 tablet by mouth daily.     . NONFORMULARY OR COMPOUNDED ITEM Apply 1 application topically 4 (four) times daily as needed (right knee pain.). 3%Diclofenac-2%Baclofen-5%Gabapentin-5%Lidocaine-1%Menthol (apply liberally up to 4 times daily for affected joint)    . Omega-3 Fatty Acids (FISH OIL) 1200 MG CAPS Take 1,200 mg by mouth 2 (two) times daily.    [name] ULTRA test strip      No current facility-administered medications on file prior to visit.   No Known Allergies  No results found for this or any previous visit (from the past 2160 hour(s)).  Objective: General: Patient is awake, alert, and oriented x 3 and in no acute distress.  Integument: Skin is warm, dry and supple bilateral. Nails are tender, long, thickened and  dystrophic with subungual debris, consistent with onychomycosis, 1-5 bilateral. Dry skin at right great toe at area of previous blister. No signs of infection. Minimal corn to bil 5th toes, No open lesions or preulcerative lesions present bilateral. Remaining integument unremarkable.  Vasculature:  Dorsalis Pedis pulse 1/4 bilateral. Posterior Tibial pulse  0/4 bilateral.  Capillary fill time <3 sec 1-5 bilateral. No hair growth to the level of the digits. Temperature gradient  within normal limits. No varicosities present bilateral. Trace edema present bilateral.   Neurology: The patient has absent sensation measured with a 5.07/10g Semmes Weinstein Monofilament at all pedal sites bilateral. Vibratory sensation absent bilateral with tuning fork. No Babinski sign present bilateral.   Musculoskeletal: Asymptomatic pes planus and bunion pedal deformities noted bilateral. Muscular strength 5/5 in all lower extremity muscular  groups bilateral without pain on range of motion . No tenderness with calf compression bilateral.  Assessment and Plan: Problem List Items Addressed This Visit   None   Visit Diagnoses    Pain due to onychomycosis of toenails of both feet    -  Primary   Diabetic polyneuropathy associated with type 2 diabetes mellitus (HCC)       PVD (peripheral vascular disease) (HCC)       Relevant Medications   hydrALAZINE (APRESOLINE) 10 MG tablet      -Examined patient. -Re-Discussed and educated patient on diabetic foot care, especially with  regards to the vascular, neurological and musculoskeletal systems.  -Mechanically debrided all nails 1-5 bilateral using sterile nail nipper and filed with dremel without incident  -Dry skin at area of previous blister with no accumulation at right first toe. -Advised patient to continue with daily skin emollients and Vaseline since this works better for her as compared to the foot miracle cream -Answered all patient questions -Patient to return  in 3 months for at risk foot care -Patient advised to call the office if any problems or questions arise in the meantime.  Asencion Islam, DPM

## 2020-09-05 IMAGING — RF ESOPHAGUS/BARIUM SWALLOW/TABLET STUDY
4 series · 12 of 12 positions shown · non-contrast
Comparison: Chest radiograph 02/21/2018.

CLINICAL DATA: Dysphagia.

EXAM:
ESOPHOGRAM/BARIUM SWALLOW
TECHNIQUE: Single contrast examination was performed using  thin barium.
FLUOROSCOPY TIME:  Fluoroscopy Time:  2 minutes and 12 seconds
Radiation Exposure Index (if provided by the fluoroscopic device):
118 mGy
Number of Acquired Spot Images: 0

[Series 1: sequence · 4 of 84 frames shown (1 of 3)]
[frame 13/84]
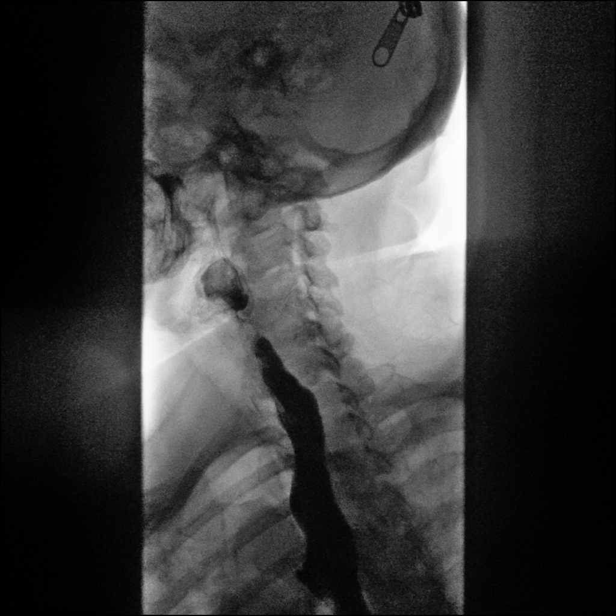
[frame 40/84]
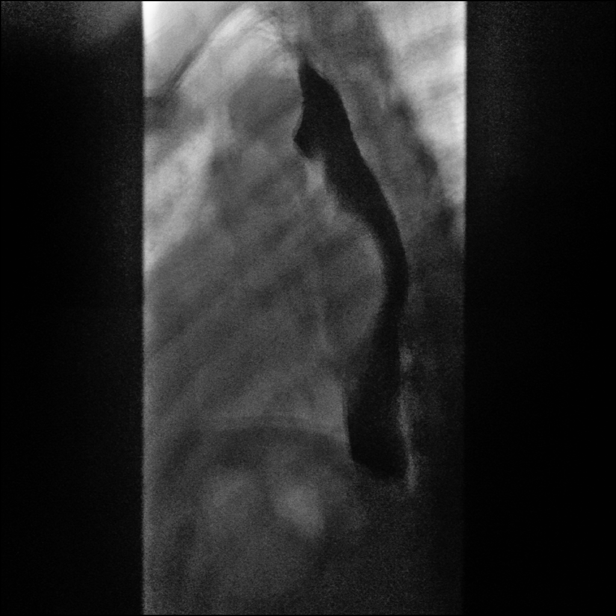
[frame 43/84]
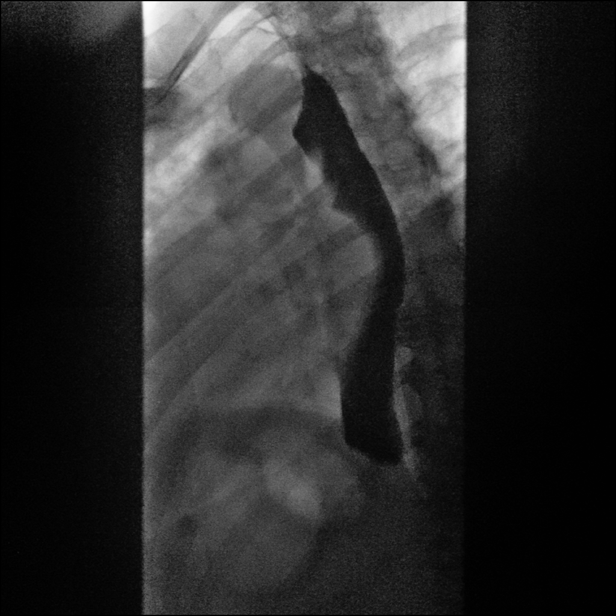
[frame 72/84]
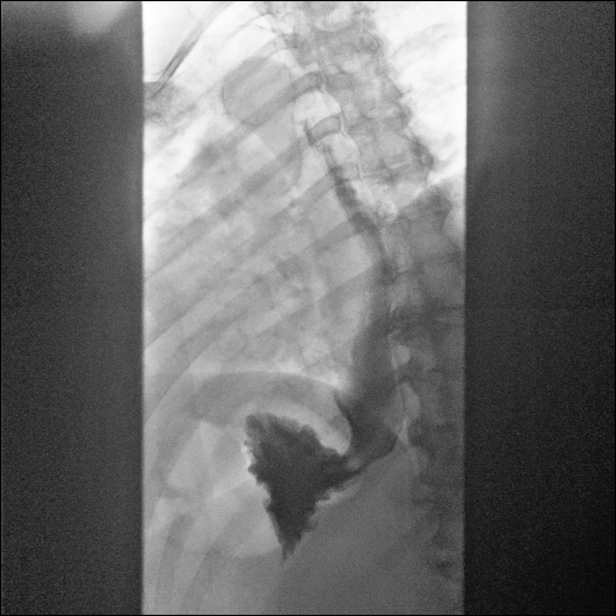

[Series 2: sequence · 3 of 46 frames shown (2 of 3)]
[frame 7/46]
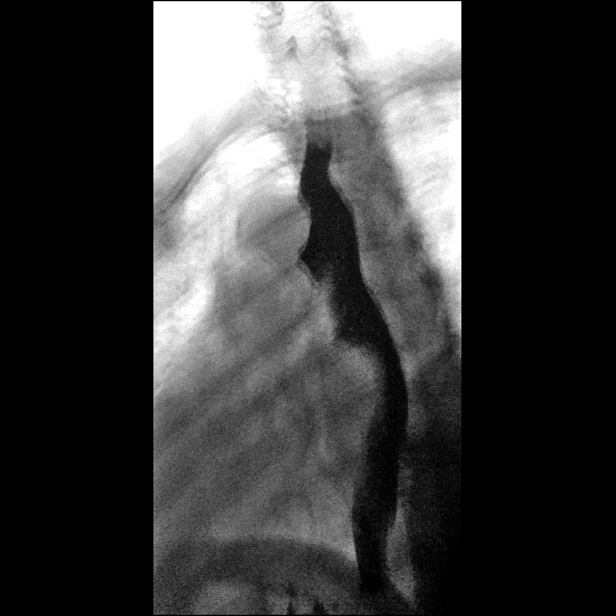
[frame 24/46]
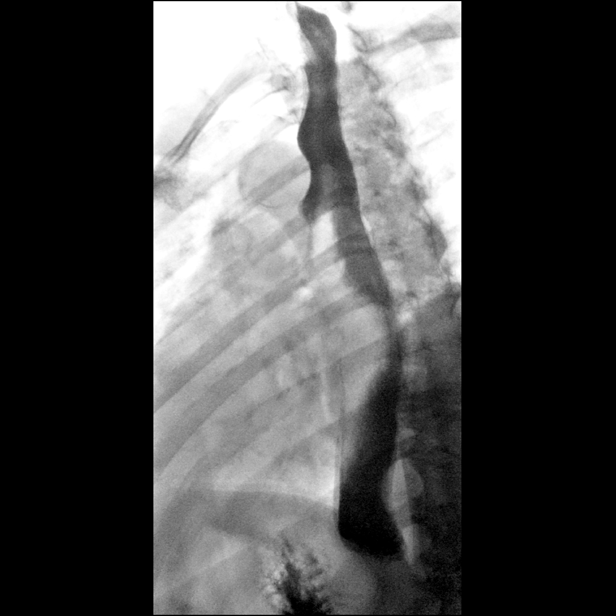
[frame 40/46]
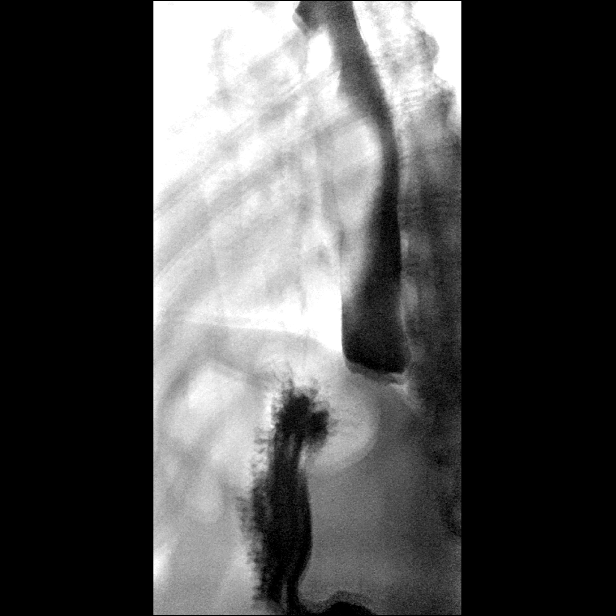

[Series 3: sequence · 4 of 105 frames shown (3 of 3)]
[frame 16/105]
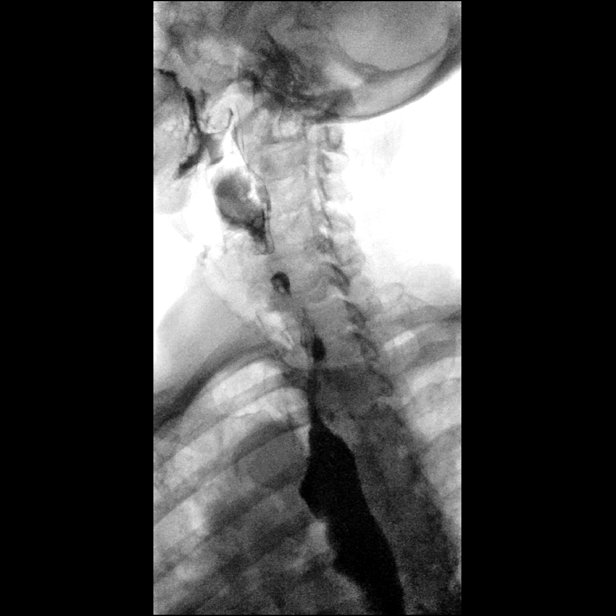
[frame 53/105]
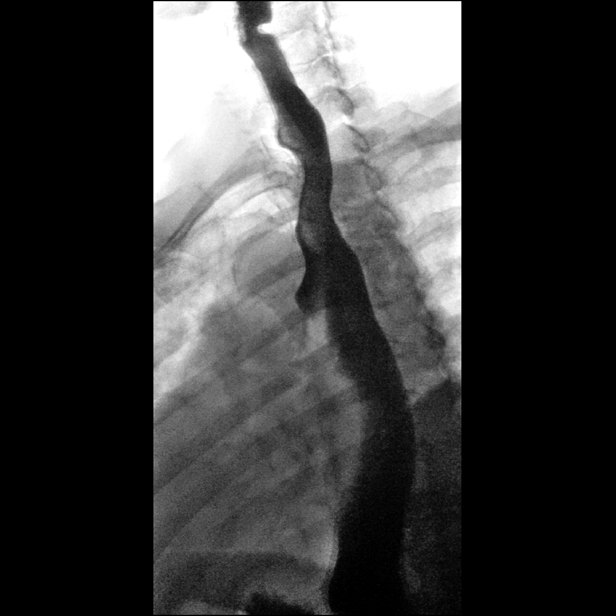
[frame 60/105]
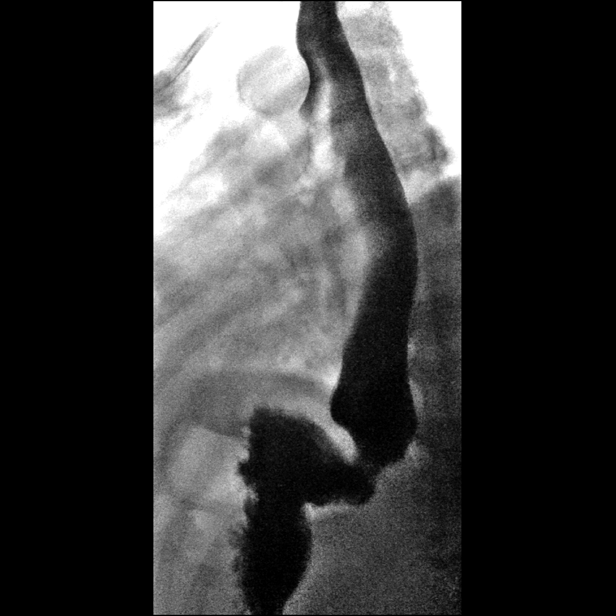
[frame 90/105]
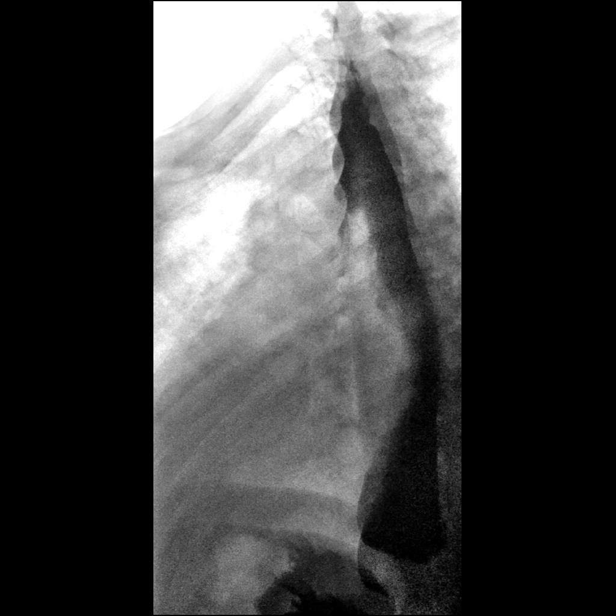

[Series 4: one shot · 1 of 1 slices shown]
[im 1/1]
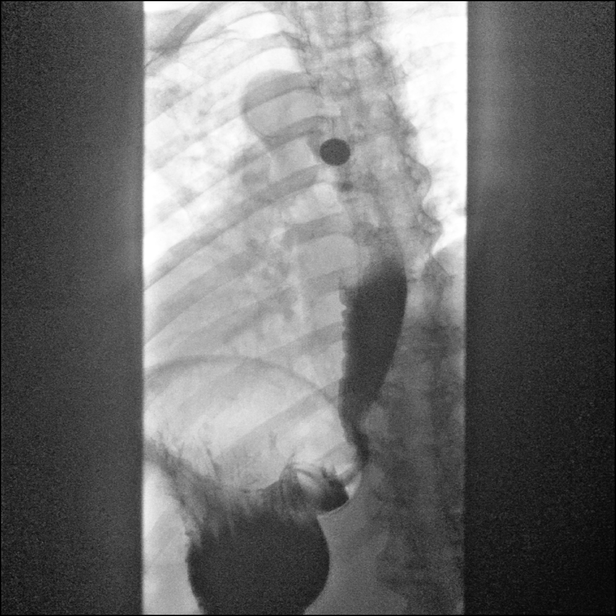

[12 of 12 positions shown; findings below may reference images not displayed]

FINDINGS: Focused, single-contrast exam performed secondary to patient
immobility and clinical status.

Evaluation of esophageal peristalsis demonstrates an incomplete
peristaltic wave with proximal escape waves and contrast stasis
throughout the esophagus.

Full column evaluation of the esophagus demonstrates a prominent
cricopharyngeus muscle. No persistent narrowing to suggest
high-grade esophageal stricture

Delayed passage of a barium tablet in the mid esophagus on series 4.
IMPRESSION: 1. Mildly limited, single-contrast exam performed as detailed above.
2. Moderate esophageal dysmotility, likely presbyesophagus.
3. No evidence of dominant esophageal stricture or persistent
narrowing.
4. Delayed passage of a barium tablet, presumably due to
dysmotility.

## 2020-09-21 NOTE — Progress Notes (Signed)
Cardiology Office Note   Date:  09/22/2020   ID:  Alexandra Green, DOB 18-Oct-1940, MRN [id number]  PCP:  [name], MD  Cardiologist:   Rollene Rotunda, MD   Chief Complaint  Patient presents with   Atrial Fibrillation       History of Present Illness: Alexandra Green is a 80 y.o. female who presents for follow up of atrial flutter after DCCV. She had a TEE cardioversion and was treated with anticoagulation with Eliquis with CHADS VASC score of 4. Other history includes cardiomyopathy, hypertension, and CHF, with diabetes.  She had ablation by Dr. Elberta Fortis.  Because this was thought to be successful she was taken off of Eliquis.    She thought that she was having atrial flutter.  However, I ordered a monitor and she had no evidence of flutter/fib.  She also had an echo which demonstrated moderately elevated pulmonary pressures. This was in Oct 2021.      She returns for follow up.   She has done well.  She gets around with a walker secondary to bad knees. The patient denies any new symptoms such as chest discomfort, neck or arm discomfort. There has been no new shortness of breath, PND or orthopnea. There have been no reported palpitations, presyncope or syncope.   Past Medical History:  Diagnosis Date   Arthritis    Atrial fibrillation with RVR (HCC) 05/14/2016   Chronic diastolic HF (heart failure) (HCC) 02/17/2019   Cough 05/31/2018   Diabetes mellitus without complication (HCC)    Hypertension    Non-ischemic cardiomyopathy (HCC) 08/26/2016    Past Surgical History:  Procedure Laterality Date   A-FLUTTER ABLATION N/A 05/04/2019   Procedure: A-FLUTTER ABLATION;  Surgeon: Regan Lemming, MD;  Location: [facility];  Service: Cardiovascular;  Laterality: N/A;   ABDOMINAL HYSTERECTOMY     CARDIOVERSION N/A 05/17/2016   Procedure: CARDIOVERSION;  Surgeon: Jake Bathe, MD;  Location: [facility] ENDOSCOPY;  Service: Cardiovascular;  Laterality: N/A;   CATARACT  EXTRACTION     CHOLECYSTECTOMY     TEE WITHOUT CARDIOVERSION N/A 05/17/2016   Procedure: TRANSESOPHAGEAL ECHOCARDIOGRAM (TEE);  Surgeon: Jake Bathe, MD;  Location: [facility] ENDOSCOPY;  Service: Cardiovascular;  Laterality: N/A;   TOTAL KNEE ARTHROPLASTY Left      Current Outpatient Medications  Medication Sig Dispense Refill   glimepiride (AMARYL) 4 MG tablet Take 4 mg by mouth daily with breakfast.     metFORMIN (GLUCOPHAGE) 500 MG tablet Take 2,000 mg by mouth at bedtime.      Multiple Vitamin (MULTIVITAMIN WITH MINERALS) TABS tablet Take 1 tablet by mouth daily.      NONFORMULARY OR COMPOUNDED ITEM Apply 1 application topically 4 (four) times daily as needed (right knee pain.). 3%Diclofenac-2%Baclofen-5%Gabapentin-5%Lidocaine-1%Menthol (apply liberally up to 4 times daily for affected joint)     Omega-3 Fatty Acids (FISH OIL) 1200 MG CAPS Take 1,200 mg by mouth 2 (two) times daily.     ONETOUCH ULTRA test strip      hydrochlorothiazide (HYDRODIURIL) 25 MG tablet Take 1 tablet (25 mg total) by mouth every morning. 90 tablet 3   losartan (COZAAR) 100 MG tablet Take 1 tablet (100 mg total) by mouth every morning. 90 tablet 3   metoprolol succinate (TOPROL-XL) 100 MG 24 hr tablet Take 1 tablet (100 mg total) by mouth daily. Take with or immediately following a meal. 90 tablet 3   No current facility-administered medications for this visit.  Allergies:   Patient has no known allergies.    ROS:  Please see the history of present illness.   Otherwise, review of systems are positive for none.   All other systems are reviewed and negative.    PHYSICAL EXAM: VS:  BP 128/64 (BP Location: Left Arm, Patient Position: Sitting, Cuff Size: Normal)   Pulse (!) 55   Ht 5\' 4"  (1.626 m)   Wt 204 lb 12.8 oz (92.9 kg)   BMI 35.15 kg/m  , BMI Body mass index is 35.15 kg/m. GEN:  No distress NECK:  No jugular venous distention at 90 degrees, waveform within normal limits, carotid upstroke brisk and  symmetric, no bruits, no thyromegaly LYMPHATICS:  No cervical adenopathy LUNGS:  Clear to auscultation bilaterally BACK:  No CVA tenderness CHEST:  Unremarkable HEART:  S1 and S2 within normal limits, no S3, no S4, no clicks, no rubs, 3 out of 6 holosystolic murmur heard best at the mid intercostal spaces murmurs ABD:  Positive bowel sounds normal in frequency in pitch, no bruits, no rebound, no guarding, unable to assess midline mass or bruit with the patient seated. EXT:  2 plus pulses throughout, mild edema , no cyanosis no clubbing SKIN:  No rashes no nodules NEURO:  Cranial nerves II through XII grossly intact, motor grossly intact throughout PSYCH:  Cognitively intact, oriented to person place and time   EKG:  EKG is  ordered today. Sinus bradycardia, rate 55, premature atrial contraction  Recent Labs: No results found for requested labs within last 8760 hours.    Lipid Panel No results found for: CHOL, TRIG, HDL, CHOLHDL, VLDL, LDLCALC, LDLDIRECT    Wt Readings from Last 3 Encounters:  09/22/20 204 lb 12.8 oz (92.9 kg)  08/20/20 205 lb (93 kg)  03/26/20 212 lb (96.2 kg)      Other studies Reviewed: Additional studies/ records that were reviewed today include: Labs Review of the above records demonstrates:  See elsewhere  ASSESSMENT AND PLAN:  Hypertension:   Her blood pressure is at target.  No change in therapy.   Atrial flutter.      I do not see any recurrent arrhythmias.  No change in therapy.   Chronic diastolic and systolic CHF:      She seems to be euvolemic  Cardiomyopathy: She has had a mildly reduced ejection fraction 40 to 45%.   However, this is better at 55% in October 2021.  I will follow with an echo again in one year.   MR:   This was mild on echo in October 2021.   I will follow as above.   PULMONARY HTN:   I will repeat an echo in October.  This will be followed in one year.   Current medicines are reviewed at length with the patient today.   The patient does not have concerns regarding medicines.  The following changes have been made: None  Labs/ tests ordered today include:  None  Orders Placed This Encounter  Procedures   EKG 12-Lead   ECHOCARDIOGRAM COMPLETE     Disposition:   FU with me in 12 months.      Signed, November, MD  09/22/2020 9:40 AM    [facility]

## 2020-09-22 ENCOUNTER — Other Ambulatory Visit: Payer: Self-pay

## 2020-09-22 ENCOUNTER — Ambulatory Visit: Payer: Medicare Other | Admitting: Cardiology

## 2020-09-22 ENCOUNTER — Encounter: Payer: Self-pay | Admitting: Cardiology

## 2020-09-22 VITALS — BP 128/64 | HR 55 | Ht 64.0 in | Wt 204.8 lb

## 2020-09-22 DIAGNOSIS — I5032 Chronic diastolic (congestive) heart failure: Secondary | ICD-10-CM

## 2020-09-22 DIAGNOSIS — I272 Pulmonary hypertension, unspecified: Secondary | ICD-10-CM

## 2020-09-22 DIAGNOSIS — I420 Dilated cardiomyopathy: Secondary | ICD-10-CM | POA: Diagnosis not present

## 2020-09-22 DIAGNOSIS — I4892 Unspecified atrial flutter: Secondary | ICD-10-CM

## 2020-09-22 MED ORDER — METOPROLOL SUCCINATE ER 100 MG PO TB24: 100.0000 mg | ORAL_TABLET | Freq: Every day | ORAL | 3 refills | Status: DC

## 2020-09-22 MED ORDER — LOSARTAN POTASSIUM 100 MG PO TABS: 100.0000 mg | ORAL_TABLET | Freq: Every morning | ORAL | 3 refills | Status: DC

## 2020-09-22 MED ORDER — HYDROCHLOROTHIAZIDE 25 MG PO TABS: 25.0000 mg | ORAL_TABLET | Freq: Every morning | ORAL | 3 refills | Status: DC

## 2020-09-22 NOTE — Patient Instructions (Signed)
Medication Instructions:  The current medical regimen is effective;  continue present plan and medications.  *If you need a refill on your cardiac medications before your next appointment, please call your pharmacy*   Testing/Procedures: Echocardiogram (12 months) - Your physician has requested that you have an echocardiogram. Echocardiography is a painless test that uses sound waves to create images of your heart. It provides your doctor with information about the size and shape of your heart and how well your heart's chambers and valves are working. This procedure takes approximately one hour. There are no restrictions for this procedure. This will be performed at our [facility] location - [address], [address].    Follow-Up: At [facility], you and your health needs are our priority.  As part of our continuing mission to provide you with exceptional heart care, we have created designated Provider Care Teams.  These Care Teams include your primary Cardiologist (physician) and Advanced Practice Providers (APPs -  Physician Assistants and Nurse Practitioners) who all work together to provide you with the care you need, when you need it.  We recommend signing up for the patient portal called "MyChart".  Sign up information is provided on this After Visit Summary.  MyChart is used to connect with patients for Virtual Visits (Telemedicine).  Patients are able to view lab/test results, encounter notes, upcoming appointments, etc.  Non-urgent messages can be sent to your provider as well.   To learn more about what you can do with MyChart, go to [email or web address].    Your next appointment:   12 month(s) after ECHO  The format for your next appointment:   In Person  Provider:   Rollene Rotunda, MD

## 2020-09-30 DIAGNOSIS — H40013 Open angle with borderline findings, low risk, bilateral: Secondary | ICD-10-CM | POA: Diagnosis not present

## 2020-11-09 DIAGNOSIS — E1169 Type 2 diabetes mellitus with other specified complication: Secondary | ICD-10-CM | POA: Diagnosis not present

## 2020-11-09 DIAGNOSIS — I10 Essential (primary) hypertension: Secondary | ICD-10-CM | POA: Diagnosis not present

## 2020-11-09 DIAGNOSIS — I480 Paroxysmal atrial fibrillation: Secondary | ICD-10-CM | POA: Diagnosis not present

## 2020-12-04 ENCOUNTER — Encounter: Payer: Self-pay | Admitting: Sports Medicine

## 2020-12-04 ENCOUNTER — Ambulatory Visit: Payer: Medicare Other | Admitting: Sports Medicine

## 2020-12-04 ENCOUNTER — Other Ambulatory Visit: Payer: Self-pay

## 2020-12-04 DIAGNOSIS — B351 Tinea unguium: Secondary | ICD-10-CM

## 2020-12-04 DIAGNOSIS — M79675 Pain in left toe(s): Secondary | ICD-10-CM

## 2020-12-04 DIAGNOSIS — E1142 Type 2 diabetes mellitus with diabetic polyneuropathy: Secondary | ICD-10-CM

## 2020-12-04 DIAGNOSIS — I739 Peripheral vascular disease, unspecified: Secondary | ICD-10-CM

## 2020-12-04 DIAGNOSIS — M79674 Pain in right toe(s): Secondary | ICD-10-CM

## 2020-12-04 NOTE — Progress Notes (Signed)
Subjective: Alexandra Green is a 80 y.o. female patient with history of diabetes who presents to office today complaining of long,mildly painful nails  while ambulating in shoes; unable to trim. Patient states that the glucose reading this morning was 130.  Last A1c 6.4  Last visit to PCP 5 months ago and will go again on next week   Patient Active Problem List   Diagnosis Date Noted   Abnormal weight loss 05/29/2020   Colon cancer screening 05/29/2020   Constipation 05/29/2020   Morbid obesity (HCC) 05/29/2020   Pulmonary HTN (HCC) 03/24/2020   Educated about COVID-19 virus infection 03/21/2019   Chronic diastolic HF (heart failure) (HCC) 02/17/2019   Cough 05/31/2018   Hypertension    Arthritis    Non-ischemic cardiomyopathy (HCC) 08/26/2016   Acute on chronic combined systolic and diastolic CHF (congestive heart failure) (HCC)    New onset atrial fibrillation (HCC) 05/15/2016   Leukocytosis 05/15/2016   Essential hypertension    Diabetes mellitus without complication (HCC)    Atrial fibrillation with RVR (HCC) 05/14/2016   New onset atrial flutter (HCC) 05/14/2016   Current Outpatient Medications on File Prior to Visit  Medication Sig Dispense Refill   glimepiride (AMARYL) 4 MG tablet Take 4 mg by mouth daily with breakfast.     hydrochlorothiazide (HYDRODIURIL) 25 MG tablet Take 1 tablet (25 mg total) by mouth every morning. 90 tablet 3   losartan (COZAAR) 100 MG tablet Take 1 tablet (100 mg total) by mouth every morning. 90 tablet 3   metFORMIN (GLUCOPHAGE) 500 MG tablet Take 2,000 mg by mouth at bedtime.      metoprolol succinate (TOPROL-XL) 100 MG 24 hr tablet Take 1 tablet (100 mg total) by mouth daily. Take with or immediately following a meal. 90 tablet 3   Multiple Vitamin (MULTIVITAMIN WITH MINERALS) TABS tablet Take 1 tablet by mouth daily.      NONFORMULARY OR COMPOUNDED ITEM Apply 1 application topically 4 (four) times daily as needed (right knee pain.).  3%Diclofenac-2%Baclofen-5%Gabapentin-5%Lidocaine-1%Menthol (apply liberally up to 4 times daily for affected joint)     Omega-3 Fatty Acids (FISH OIL) 1200 MG CAPS Take 1,200 mg by mouth 2 (two) times daily.     ONETOUCH ULTRA test strip      No current facility-administered medications on file prior to visit.   No Known Allergies  No results found for this or any previous visit (from the past 2160 hour(s)).  Objective: General: Patient is awake, alert, and oriented x 3 and in no acute distress.  Integument: Skin is warm, dry and supple bilateral. Nails are tender, long, thickened and  dystrophic with subungual debris, consistent with onychomycosis, 1-5 bilateral.  Minimal corn to bil 5th toes, No open lesions or preulcerative lesions present bilateral. Remaining integument unremarkable.  Vasculature:  Dorsalis Pedis pulse 1/4 bilateral. Posterior Tibial pulse  0/4 bilateral.  Capillary fill time <3 sec 1-5 bilateral. No hair growth to the level of the digits. Temperature gradient within normal limits. No varicosities present bilateral. Trace edema present bilateral.   Neurology: The patient has absent sensation measured with a 5.07/10g Semmes Weinstein Monofilament at all pedal sites bilateral. Vibratory sensation absent bilateral with tuning fork. No Babinski sign present bilateral.   Musculoskeletal: Asymptomatic pes planus and bunion pedal deformities noted bilateral. Muscular strength 5/5 in all lower extremity muscular groups bilateral without pain on range of motion . No tenderness with calf compression bilateral.  Assessment and Plan: Problem List Items  Addressed This Visit   None Visit Diagnoses     Pain due to onychomycosis of toenails of both feet    -  Primary   Diabetic polyneuropathy associated with type 2 diabetes mellitus (HCC)       PVD (peripheral vascular disease) (HCC)           -Examined patient. -Re-Discussed and educated patient on diabetic foot care,  especially with  regards to the vascular, neurological and musculoskeletal systems.  -Mechanically debrided all nails 1-5 bilateral using sterile nail nipper and filed with dremel without incident  -Answered all patient questions -Patient to return  in 3 months for at risk foot care -Patient advised to call the office if any problems or questions arise in the meantime.  Asencion Islam, DPM

## 2020-12-10 DIAGNOSIS — I480 Paroxysmal atrial fibrillation: Secondary | ICD-10-CM | POA: Diagnosis not present

## 2020-12-10 DIAGNOSIS — I10 Essential (primary) hypertension: Secondary | ICD-10-CM | POA: Diagnosis not present

## 2020-12-10 DIAGNOSIS — E1169 Type 2 diabetes mellitus with other specified complication: Secondary | ICD-10-CM | POA: Diagnosis not present

## 2021-01-05 ENCOUNTER — Other Ambulatory Visit: Payer: Self-pay | Admitting: Cardiology

## 2021-01-05 DIAGNOSIS — I10 Essential (primary) hypertension: Secondary | ICD-10-CM

## 2021-01-09 DIAGNOSIS — I480 Paroxysmal atrial fibrillation: Secondary | ICD-10-CM | POA: Diagnosis not present

## 2021-01-09 DIAGNOSIS — E1169 Type 2 diabetes mellitus with other specified complication: Secondary | ICD-10-CM | POA: Diagnosis not present

## 2021-01-09 DIAGNOSIS — I10 Essential (primary) hypertension: Secondary | ICD-10-CM | POA: Diagnosis not present

## 2021-02-09 DIAGNOSIS — E1169 Type 2 diabetes mellitus with other specified complication: Secondary | ICD-10-CM | POA: Diagnosis not present

## 2021-02-09 DIAGNOSIS — I480 Paroxysmal atrial fibrillation: Secondary | ICD-10-CM | POA: Diagnosis not present

## 2021-02-09 DIAGNOSIS — I10 Essential (primary) hypertension: Secondary | ICD-10-CM | POA: Diagnosis not present

## 2021-02-18 ENCOUNTER — Other Ambulatory Visit: Payer: Self-pay | Admitting: Cardiology

## 2021-02-18 DIAGNOSIS — I10 Essential (primary) hypertension: Secondary | ICD-10-CM

## 2021-03-12 ENCOUNTER — Ambulatory Visit: Payer: Medicare Other | Admitting: Sports Medicine

## 2021-06-19 DIAGNOSIS — Z7984 Long term (current) use of oral hypoglycemic drugs: Secondary | ICD-10-CM | POA: Diagnosis not present

## 2021-06-19 DIAGNOSIS — H40013 Open angle with borderline findings, low risk, bilateral: Secondary | ICD-10-CM | POA: Diagnosis not present

## 2021-06-19 DIAGNOSIS — E119 Type 2 diabetes mellitus without complications: Secondary | ICD-10-CM | POA: Diagnosis not present

## 2021-06-24 DIAGNOSIS — I10 Essential (primary) hypertension: Secondary | ICD-10-CM | POA: Diagnosis not present

## 2021-06-24 DIAGNOSIS — E1169 Type 2 diabetes mellitus with other specified complication: Secondary | ICD-10-CM | POA: Diagnosis not present

## 2021-06-24 DIAGNOSIS — I4891 Unspecified atrial fibrillation: Secondary | ICD-10-CM | POA: Diagnosis not present

## 2021-06-24 DIAGNOSIS — I509 Heart failure, unspecified: Secondary | ICD-10-CM | POA: Diagnosis not present

## 2021-06-24 DIAGNOSIS — E038 Other specified hypothyroidism: Secondary | ICD-10-CM | POA: Diagnosis not present

## 2021-06-24 DIAGNOSIS — I739 Peripheral vascular disease, unspecified: Secondary | ICD-10-CM | POA: Diagnosis not present

## 2021-06-25 ENCOUNTER — Ambulatory Visit (INDEPENDENT_AMBULATORY_CARE_PROVIDER_SITE_OTHER): Payer: Medicare Other | Admitting: Sports Medicine

## 2021-06-25 ENCOUNTER — Encounter: Payer: Self-pay | Admitting: Sports Medicine

## 2021-06-25 ENCOUNTER — Other Ambulatory Visit: Payer: Self-pay

## 2021-06-25 DIAGNOSIS — B351 Tinea unguium: Secondary | ICD-10-CM | POA: Diagnosis not present

## 2021-06-25 DIAGNOSIS — M79675 Pain in left toe(s): Secondary | ICD-10-CM | POA: Diagnosis not present

## 2021-06-25 DIAGNOSIS — M79674 Pain in right toe(s): Secondary | ICD-10-CM | POA: Diagnosis not present

## 2021-06-25 NOTE — Progress Notes (Signed)
?  ?Subjective: ?Alexandra Green is a 81 y.o. female patient with history of diabetes who presents to office today complaining of long,mildly painful nails  while ambulating in shoes; unable to trim. Patient states that the glucose reading this morning was not recorded she did not check.  Last A1c 6.1 and last visit to PCP, Dr.  [name], MD was 06/24/2021. ? ? ?Patient Active Problem List  ? Diagnosis Date Noted  ? Abnormal weight loss 05/29/2020  ? Colon cancer screening 05/29/2020  ? Constipation 05/29/2020  ? Morbid obesity (HCC) 05/29/2020  ? Pulmonary HTN (HCC) 03/24/2020  ? Educated about COVID-19 virus infection 03/21/2019  ? Chronic diastolic HF (heart failure) (HCC) 02/17/2019  ? Cough 05/31/2018  ? Hypertension   ? Arthritis   ? Non-ischemic cardiomyopathy (HCC) 08/26/2016  ? Acute on chronic combined systolic and diastolic CHF (congestive heart failure) (HCC)   ? New onset atrial fibrillation (HCC) 05/15/2016  ? Leukocytosis 05/15/2016  ? Essential hypertension   ? Diabetes mellitus without complication (HCC)   ? Atrial fibrillation with RVR (HCC) 05/14/2016  ? New onset atrial flutter (HCC) 05/14/2016  ? ?Current Outpatient Medications on File Prior to Visit  ?Medication Sig Dispense Refill  ? glimepiride (AMARYL) 4 MG tablet Take 4 mg by mouth daily with breakfast.    ? hydrALAZINE (APRESOLINE) 25 MG tablet TAKE 1 TABLET BY MOUTH  TWICE DAILY 90 tablet 3  ? hydrochlorothiazide (HYDRODIURIL) 25 MG tablet Take 1 tablet (25 mg total) by mouth every morning. 90 tablet 3  ? losartan (COZAAR) 100 MG tablet Take 1 tablet (100 mg total) by mouth every morning. 90 tablet 3  ? metFORMIN (GLUCOPHAGE) 500 MG tablet Take 2,000 mg by mouth at bedtime.     ? metoprolol succinate (TOPROL-XL) 100 MG 24 hr tablet Take 1 tablet (100 mg total) by mouth daily. Take with or immediately following a meal. 90 tablet 3  ? Multiple Vitamin (MULTIVITAMIN WITH MINERALS) TABS tablet Take 1 tablet by mouth daily.     ?  NONFORMULARY OR COMPOUNDED ITEM Apply 1 application topically 4 (four) times daily as needed (right knee pain.). 3%Diclofenac-2%Baclofen-5%Gabapentin-5%Lidocaine-1%Menthol (apply liberally up to 4 times daily for affected joint)    ? Omega-3 Fatty Acids (FISH OIL) 1200 MG CAPS Take 1,200 mg by mouth 2 (two) times daily.    ? ONETOUCH ULTRA test strip     ? ?No current facility-administered medications on file prior to visit.  ? ?No Known Allergies ? ?No results found for this or any previous visit (from the past 2160 hour(s)). ? ?Objective: ?General: Patient is awake, alert, and oriented x 3 and in no acute distress. ? ?Integument: Skin is warm, dry and supple bilateral. Nails are tender, long, thickened and dystrophic with subungual debris, consistent with onychomycosis, 1-5 bilateral.  Minimal corn to bil 5th toes, blanchable erythema noted to the distal tuft of the right hallux.  No open lesions or preulcerative lesions present bilateral. Remaining integument unremarkable. ? ?Vasculature:  Dorsalis Pedis pulse 1/4 bilateral. Posterior Tibial pulse  0/4 bilateral. Capillary fill time <3 sec 1-5 bilateral. No hair growth to the level of the digits.Temperature gradient within normal limits.  Minimal varicosities present bilateral. Trace edema present bilateral.  ? ?Neurology: The patient has absent sensation measured with a 5.07/10g Semmes Weinstein Monofilament at all pedal sites bilateral. Vibratory sensation absent bilateral with tuning fork which is unchanged from prior. ? ?Musculoskeletal: Asymptomatic pes planus and bunion pedal deformities noted bilateral. Muscular  strength 5/5 in all lower extremity muscular groups bilateral without pain on range of motion . No tenderness with calf compression bilateral. ? ?Assessment and Plan: ?Problem List Items Addressed This Visit   ?None ?Visit Diagnoses   ? ? Pain due to onychomycosis of toenails of both feet    -  Primary  ? Diabetic polyneuropathy associated with  type 2 diabetes mellitus (HCC)      ? PVD (peripheral vascular disease) (HCC)      ? ?  ? ? ?-Examined patient. ?-Re-Discussed and educated patient on diabetic foot care, especially with  ?regards to the vascular, neurological and musculoskeletal systems.  ?-Mechanically debrided all nails 1-5 bilateral using sterile nail nipper and filed with dremel without incident  ?-Advised patient to be careful with her compression garments that they are not too tight rubbing the tip of the toe especially at the right great toe at this time there was no wound but there was a small area of erythema noted. ?-Answered all patient questions ?-Patient to return  in 3 to 4 months for at risk diabetic foot care with Dr. Eloy End ?-Patient advised to call the office if any problems or questions arise in the meantime. ? ?Asencion Islam, DPM ?

## 2021-07-09 ENCOUNTER — Other Ambulatory Visit: Payer: Self-pay | Admitting: Cardiology

## 2021-07-09 DIAGNOSIS — I10 Essential (primary) hypertension: Secondary | ICD-10-CM

## 2021-07-13 DIAGNOSIS — H25812 Combined forms of age-related cataract, left eye: Secondary | ICD-10-CM | POA: Diagnosis not present

## 2021-07-13 DIAGNOSIS — Z01818 Encounter for other preprocedural examination: Secondary | ICD-10-CM | POA: Diagnosis not present

## 2021-07-22 DIAGNOSIS — H25812 Combined forms of age-related cataract, left eye: Secondary | ICD-10-CM | POA: Diagnosis not present

## 2021-07-22 DIAGNOSIS — H2512 Age-related nuclear cataract, left eye: Secondary | ICD-10-CM | POA: Diagnosis not present

## 2021-08-31 ENCOUNTER — Other Ambulatory Visit: Payer: Self-pay | Admitting: Cardiology

## 2021-09-21 ENCOUNTER — Ambulatory Visit (HOSPITAL_COMMUNITY): Payer: Medicare Other | Attending: Cardiology

## 2021-09-21 DIAGNOSIS — I272 Pulmonary hypertension, unspecified: Secondary | ICD-10-CM | POA: Insufficient documentation

## 2021-09-21 LAB — ECHOCARDIOGRAM COMPLETE
Area-P 1/2: 2.34 cm2
P 1/2 time: 635 msec
S' Lateral: 2.3 cm

## 2021-10-12 ENCOUNTER — Other Ambulatory Visit: Payer: Self-pay | Admitting: Cardiology

## 2021-10-22 ENCOUNTER — Other Ambulatory Visit: Payer: Self-pay | Admitting: Cardiology

## 2021-10-22 DIAGNOSIS — I10 Essential (primary) hypertension: Secondary | ICD-10-CM

## 2021-10-22 NOTE — Progress Notes (Signed)
Cardiology Office Note   Date:  10/23/2021   ID:  [name], DOB June 05, 1940, MRN [id number]  PCP:  [name], MD  Cardiologist:   Rollene Rotunda, MD   Chief Complaint  Patient presents with   Pulmonary HTN     History of Present Illness: Alexandra Green is a 81 y.o. female who presents for follow up of atrial flutter after DCCV. She had a TEE cardioversion and was treated with anticoagulation with Eliquis with CHADS VASC score of 4. Other history includes cardiomyopathy, hypertension, and CHF, with diabetes.  She had ablation by Dr. Elberta Fortis.  Because this was thought to be successful she was taken off of Eliquis.    She thought that she was having atrial flutter.  However, I ordered a monitor and she had no evidence of flutter/fib.  She also had an echo which demonstrated moderately elevated pulmonary pressures. This was in Oct 2021.     I sent her for a repeat echo in June. In 2021 estimated pulmonary pressure was 47.1.  It is slightly increased and moderately elevated now.  There is normal right ventricular systolic function.  She has normal ejection fraction 55 to 60%.   She returns for follow up.    She is actually done relatively well since I saw her.  She gets around slowly with a walker because of her joint problems and her bad knees. The patient denies any new symptoms such as chest discomfort, neck or arm discomfort. There has been no new shortness of breath, PND or orthopnea. There have been no reported palpitations, presyncope or syncope.     Past Medical History:  Diagnosis Date   Arthritis    Atrial fibrillation with RVR (HCC) 05/14/2016   Chronic diastolic HF (heart failure) (HCC) 02/17/2019   Cough 05/31/2018   Diabetes mellitus without complication (HCC)    Hypertension    Non-ischemic cardiomyopathy (HCC) 08/26/2016    Past Surgical History:  Procedure Laterality Date   A-FLUTTER ABLATION N/A 05/04/2019   Procedure: A-FLUTTER ABLATION;  Surgeon:  Regan Lemming, MD;  Location: [facility] INVASIVE CV LAB;  Service: Cardiovascular;  Laterality: N/A;   ABDOMINAL HYSTERECTOMY     CARDIOVERSION N/A 05/17/2016   Procedure: CARDIOVERSION;  Surgeon: Jake Bathe, MD;  Location: [facility] ENDOSCOPY;  Service: Cardiovascular;  Laterality: N/A;   CATARACT EXTRACTION     CHOLECYSTECTOMY     TEE WITHOUT CARDIOVERSION N/A 05/17/2016   Procedure: TRANSESOPHAGEAL ECHOCARDIOGRAM (TEE);  Surgeon: Jake Bathe, MD;  Location: [facility] ENDOSCOPY;  Service: Cardiovascular;  Laterality: N/A;   TOTAL KNEE ARTHROPLASTY Left      Current Outpatient Medications  Medication Sig Dispense Refill   glimepiride (AMARYL) 4 MG tablet Take 4 mg by mouth daily with breakfast.     hydrALAZINE (APRESOLINE) 10 MG tablet Take 1 tablet (10 mg total) by mouth 2 (two) times daily. 180 tablet 3   hydrALAZINE (APRESOLINE) 25 MG tablet TAKE 1 TABLET BY MOUTH  TWICE DAILY 90 tablet 3   hydrochlorothiazide (HYDRODIURIL) 25 MG tablet TAKE 1 TABLET BY MOUTH IN  THE MORNING 90 tablet 3   losartan (COZAAR) 100 MG tablet TAKE 1 TABLET BY MOUTH IN  THE MORNING 90 tablet 3   metFORMIN (GLUCOPHAGE) 500 MG tablet Take 2,000 mg by mouth at bedtime.      metoprolol succinate (TOPROL-XL) 100 MG 24 hr tablet Take 1 tablet (100 mg total) by mouth daily. SCHEDULE OFFICE VISIT FOR FUTURE REFILLS. 30 tablet  0   Multiple Vitamin (MULTIVITAMIN WITH MINERALS) TABS tablet Take 1 tablet by mouth daily.      NONFORMULARY OR COMPOUNDED ITEM Apply 1 application topically 4 (four) times daily as needed (right knee pain.). 3%Diclofenac-2%Baclofen-5%Gabapentin-5%Lidocaine-1%Menthol (apply liberally up to 4 times daily for affected joint)     Omega-3 Fatty Acids (FISH OIL) 1200 MG CAPS Take 1,200 mg by mouth 2 (two) times daily.     ONETOUCH ULTRA test strip      No current facility-administered medications for this visit.    Allergies:   Patient has no known allergies.    ROS:  Please see the history of present  illness.   Otherwise, review of systems are positive for none.   All other systems are reviewed and negative.    PHYSICAL EXAM: VS:  BP (!) 150/84   Pulse (!) 57   Ht 5\' 3"  (1.6 m)   Wt 196 lb 12.8 oz (89.3 kg)   SpO2 100%   BMI 34.86 kg/m  , BMI Body mass index is 34.86 kg/m. GEN:  No distress NECK:  No jugular venous distention at 90 degrees, waveform within normal limits, carotid upstroke brisk and symmetric, no bruits, no thyromegaly LYMPHATICS:  No cervical adenopathy LUNGS:  Clear to auscultation bilaterally BACK:  No CVA tenderness CHEST:  Unremarkable HEART:  S1 and S2 within normal limits, no S3, no S4, no clicks, no rubs, 3 out of 6 holosystolic murmur heard best at the mid intercostal spaces murmurs ABD:  Positive bowel sounds normal in frequency in pitch, no bruits, no rebound, no guarding, unable to assess midline mass or bruit with the patient seated. EXT:  2 plus pulses throughout, mild edema , no cyanosis no clubbing SKIN:  No rashes no nodules NEURO:  Cranial nerves II through XII grossly intact, motor grossly intact throughout PSYCH:  Cognitively intact, oriented to person place and time   EKG:  EKG is  ordered today. Sinus bradycardia, rate 57, premature atrial contraction.  Left axis deviation, left ventricular hypertrophy  Recent Labs: No results found for requested labs within last 365 days.    Lipid Panel No results found for: "CHOL", "TRIG", "HDL", "CHOLHDL", "VLDL", "LDLCALC", "LDLDIRECT"    Wt Readings from Last 3 Encounters:  10/23/21 196 lb 12.8 oz (89.3 kg)  09/22/20 204 lb 12.8 oz (92.9 kg)  08/20/20 205 lb (93 kg)      Other studies Reviewed: Additional studies/ records that were reviewed today include: Labs, echo Review of the above records demonstrates:  See elsewhere  ASSESSMENT AND PLAN:  Hypertension:   Her blood pressure is mildly elevated.  I am going to increase her hydralazine by adding another 10 mg twice daily to make this 35  mg twice daily  Atrial flutter.    She has had no symptomatic recurrence of this.  No change in therapy.  Chronic diastolic and systolic CHF:   She seems to be euvolemic.  No change in therapy.  Cardiomyopathy: She has had a mildly reduced ejection fraction 40 to 45%.   However, this is better at 55% in October 2021 and again last month when it was 55 to 65%.   MR:   This was evaluated on echo recently.  No change in therapy.    PULMONARY HTN:    As mentioned above she has mildly elevated pulmonary pressures.   However, she has no symptoms related to this.  I would not suspect that this is WHO 1  I will repeat an echo in October.  This will be followed in one year.   Current medicines are reviewed at length with the patient today.  The patient does not have concerns regarding medicines.  The following changes have been made: As above  Labs/ tests ordered today include: None  Orders Placed This Encounter  Procedures   EKG 12-Lead     Disposition:   FU with me in 12 months.      Signed, Rollene Rotunda, MD  10/23/2021 10:34 AM    [facility]

## 2021-10-23 ENCOUNTER — Ambulatory Visit: Payer: Medicare Other | Admitting: Cardiology

## 2021-10-23 ENCOUNTER — Other Ambulatory Visit: Payer: Self-pay

## 2021-10-23 ENCOUNTER — Encounter: Payer: Self-pay | Admitting: Cardiology

## 2021-10-23 VITALS — BP 150/84 | HR 57 | Ht 63.0 in | Wt 196.8 lb

## 2021-10-23 DIAGNOSIS — I10 Essential (primary) hypertension: Secondary | ICD-10-CM

## 2021-10-23 DIAGNOSIS — I272 Pulmonary hypertension, unspecified: Secondary | ICD-10-CM | POA: Diagnosis not present

## 2021-10-23 DIAGNOSIS — I5032 Chronic diastolic (congestive) heart failure: Secondary | ICD-10-CM

## 2021-10-23 MED ORDER — HYDRALAZINE HCL 25 MG PO TABS: 25.0000 mg | ORAL_TABLET | Freq: Two times a day (BID) | ORAL | 3 refills | Status: DC

## 2021-10-23 MED ORDER — HYDRALAZINE HCL 10 MG PO TABS: 10.0000 mg | ORAL_TABLET | Freq: Two times a day (BID) | ORAL | 3 refills | Status: DC

## 2021-10-23 MED ORDER — METOPROLOL SUCCINATE ER 100 MG PO TB24: 100.0000 mg | ORAL_TABLET | Freq: Every day | ORAL | 3 refills | Status: DC

## 2021-10-23 NOTE — Addendum Note (Signed)
Addended by: Freddi Starr on: 10/23/2021 10:41 AM   Modules accepted: Orders

## 2021-10-23 NOTE — Patient Instructions (Signed)
Medication Instructions:   ADD HYDRALAZINE 10 MG TWICE DAILY=35 MG OF HYDRALAZINE TWICE DAILY *If you need a refill on your cardiac medications before your next appointment, please call your pharmacy*   Follow-Up: At [facility], you and your health needs are our priority.  As part of our continuing mission to provide you with exceptional heart care, we have created designated Provider Care Teams.  These Care Teams include your primary Cardiologist (physician) and Advanced Practice Providers (APPs -  Physician Assistants and Nurse Practitioners) who all work together to provide you with the care you need, when you need it.  We recommend signing up for the patient portal called "MyChart".  Sign up information is provided on this After Visit Summary.  MyChart is used to connect with patients for Virtual Visits (Telemedicine).  Patients are able to view lab/test results, encounter notes, upcoming appointments, etc.  Non-urgent messages can be sent to your provider as well.   To learn more about what you can do with MyChart, go to [email or web address].    Your next appointment:   12 month(s)  The format for your next appointment:   In Person  Provider:   Rollene Rotunda, MD       Important Information About Sugar

## 2021-10-26 ENCOUNTER — Encounter: Payer: Self-pay | Admitting: Podiatry

## 2021-10-26 ENCOUNTER — Telehealth: Payer: Self-pay | Admitting: Cardiology

## 2021-10-26 ENCOUNTER — Ambulatory Visit: Payer: Medicare Other | Admitting: Podiatry

## 2021-10-26 DIAGNOSIS — L84 Corns and callosities: Secondary | ICD-10-CM

## 2021-10-26 DIAGNOSIS — E1142 Type 2 diabetes mellitus with diabetic polyneuropathy: Secondary | ICD-10-CM

## 2021-10-26 DIAGNOSIS — M79674 Pain in right toe(s): Secondary | ICD-10-CM | POA: Diagnosis not present

## 2021-10-26 DIAGNOSIS — B351 Tinea unguium: Secondary | ICD-10-CM | POA: Diagnosis not present

## 2021-10-26 DIAGNOSIS — I739 Peripheral vascular disease, unspecified: Secondary | ICD-10-CM | POA: Diagnosis not present

## 2021-10-26 DIAGNOSIS — M79675 Pain in left toe(s): Secondary | ICD-10-CM | POA: Diagnosis not present

## 2021-10-26 NOTE — Telephone Encounter (Signed)
Pt is returning call. Requesting call back.  

## 2021-10-26 NOTE — Telephone Encounter (Signed)
Left a message for the patient to call back.  

## 2021-10-26 NOTE — Telephone Encounter (Signed)
The patient stated that [facility] called her with questions about the Hydralazine. They did not leave a call back number.   [facility] has been called and 35 mg of Hydralazine bid has been confirmed (25mg  plus 10 mg=35 mg bid). Nothing further needed.

## 2021-10-26 NOTE — Telephone Encounter (Signed)
Pt c/o medication issue:  1. Name of Medication: hydrALAZINE (APRESOLINE) 25 MG tablet  2. How are you currently taking this medication (dosage and times per day)? As prescribed   3. Are you having a reaction (difficulty breathing--STAT)?   4. What is your medication issue? Pt is calling stating that her pharmacy, [facility], called her stating that the doctors office needs to call with more information for this medication.

## 2021-11-01 NOTE — Progress Notes (Signed)
  Subjective:  Patient ID: Alexandra Green, female    DOB: 1940/10/29,  MRN: [id number]  Alexandra Green presents to clinic today for at risk footcare. Patient has h/o diabetes, neuropathy and PAD and is seen for  and painful elongated mycotic toenails 1-5 bilaterally which are tender when wearing enclosed shoe gear. Pain is relieved with periodic professional debridement.  Last A1c was 6.1%. Patient did not check blood glucose today.  New problem(s): None.   PCP is [name], MD , and last visit was  April, 2023.  No Known Allergies  Review of Systems: Negative except as noted in the HPI.  Objective: No changes noted in today's physical examination. Vascular Examination: CFT <3 seconds b/l. DP pulses faintly palpable b/l. PT pulses diminished b/l. Digital hair absent. Skin temperature gradient warm to warm b/l. No ischemia or gangrene. No cyanosis or clubbing noted b/l. Trace edema noted BLE.   Neurological Examination: Protective sensation diminished with 10g monofilament b/l. Vibratory sensation diminished b/l.  Dermatological Examination: Pedal skin warm and supple b/l. Toenails 1-5 b/l thick, discolored, elongated with subungual debris and pain on dorsal palpation.  Hyperkeratotic lesion(s) dorsal PIPJ b/l 5th digits.  No erythema, no edema, no drainage, no fluctuance.  Musculoskeletal Examination: Muscle strength 5/5 to b/l LE. HAV with bunion deformity noted b/l LE. Hammertoe(s) noted to the bilateral 5th toes. Pes planus deformity noted bilateral LE.  Radiographs: None  Assessment/Plan: 1. Pain due to onychomycosis of toenails of both feet   2. Corns   3. PAD (peripheral artery disease) (HCC)   4. Diabetic polyneuropathy associated with type 2 diabetes mellitus (HCC)      -Patient was evaluated and treated. All patient's and/or POA's questions/concerns answered on today's visit. -Continue foot and shoe inspections daily. Monitor blood glucose per  PCP/Endocrinologist's recommendations. -Mycotic toenails 1-5 bilaterally were debrided in length and girth with sterile nail nippers and dremel without incident. -Corn(s) bilateral 5th toes pared utilizing sterile scalpel blade without complication or incident. Total number debrided=2. -Patient/POA to call should there be question/concern in the interim.   Return in about 3 months (around 01/26/2022).  Freddie Breech, DPM

## 2021-12-25 DIAGNOSIS — I4891 Unspecified atrial fibrillation: Secondary | ICD-10-CM | POA: Diagnosis not present

## 2021-12-25 DIAGNOSIS — I119 Hypertensive heart disease without heart failure: Secondary | ICD-10-CM | POA: Diagnosis not present

## 2021-12-25 DIAGNOSIS — Z0000 Encounter for general adult medical examination without abnormal findings: Secondary | ICD-10-CM | POA: Diagnosis not present

## 2021-12-25 DIAGNOSIS — I739 Peripheral vascular disease, unspecified: Secondary | ICD-10-CM | POA: Diagnosis not present

## 2022-03-01 ENCOUNTER — Ambulatory Visit: Payer: Medicare Other | Admitting: Podiatry

## 2022-03-01 ENCOUNTER — Encounter: Payer: Self-pay | Admitting: Podiatry

## 2022-03-01 DIAGNOSIS — B351 Tinea unguium: Secondary | ICD-10-CM | POA: Diagnosis not present

## 2022-03-01 DIAGNOSIS — M79675 Pain in left toe(s): Secondary | ICD-10-CM

## 2022-03-01 DIAGNOSIS — I739 Peripheral vascular disease, unspecified: Secondary | ICD-10-CM

## 2022-03-01 DIAGNOSIS — E1142 Type 2 diabetes mellitus with diabetic polyneuropathy: Secondary | ICD-10-CM | POA: Diagnosis not present

## 2022-03-01 DIAGNOSIS — M79674 Pain in right toe(s): Secondary | ICD-10-CM

## 2022-03-01 DIAGNOSIS — E119 Type 2 diabetes mellitus without complications: Secondary | ICD-10-CM | POA: Diagnosis not present

## 2022-03-01 DIAGNOSIS — M2042 Other hammer toe(s) (acquired), left foot: Secondary | ICD-10-CM

## 2022-03-01 DIAGNOSIS — M2041 Other hammer toe(s) (acquired), right foot: Secondary | ICD-10-CM | POA: Diagnosis not present

## 2022-03-01 DIAGNOSIS — L84 Corns and callosities: Secondary | ICD-10-CM | POA: Diagnosis not present

## 2022-03-06 NOTE — Progress Notes (Signed)
ANNUAL DIABETIC FOOT EXAM  Subjective: Alexandra Green presents today for annual diabetic foot examination.  Chief Complaint  Patient presents with   Nail Problem    Diabetic foot care BS-did not check today A1C-5.9 PCP-Bland PCP Vst-2 months ago    Patient confirms h/o diabetes.  Patient relates 25 year h/o diabetes.  Patient denies any h/o foot wounds.  Patient states she has neuropathy.   Risk factors: diabetes, diabetic neuropathy, HTN, CHF.  [name], MD is patient's PCP.  Past Medical History:  Diagnosis Date   Arthritis    Atrial fibrillation with RVR (HCC) 05/14/2016   Chronic diastolic HF (heart failure) (HCC) 02/17/2019   Cough 05/31/2018   Diabetes mellitus without complication (HCC)    Hypertension    Non-ischemic cardiomyopathy (HCC) 08/26/2016   Patient Active Problem List   Diagnosis Date Noted   Abnormal weight loss 05/29/2020   Colon cancer screening 05/29/2020   Constipation 05/29/2020   Morbid obesity (HCC) 05/29/2020   Pulmonary HTN (HCC) 03/24/2020   Educated about COVID-19 virus infection 03/21/2019   Chronic diastolic HF (heart failure) (HCC) 02/17/2019   Cough 05/31/2018   Hypertension    Arthritis    Non-ischemic cardiomyopathy (HCC) 08/26/2016   Acute on chronic combined systolic and diastolic CHF (congestive heart failure) (HCC)    New onset atrial fibrillation (HCC) 05/15/2016   Leukocytosis 05/15/2016   Essential hypertension    Diabetes mellitus without complication (HCC)    Atrial fibrillation with RVR (HCC) 05/14/2016   New onset atrial flutter (HCC) 05/14/2016   Past Surgical History:  Procedure Laterality Date   A-FLUTTER ABLATION N/A 05/04/2019   Procedure: A-FLUTTER ABLATION;  Surgeon: Regan Lemming, MD;  Location: [facility] INVASIVE CV LAB;  Service: Cardiovascular;  Laterality: N/A;   ABDOMINAL HYSTERECTOMY     CARDIOVERSION N/A 05/17/2016   Procedure: CARDIOVERSION;  Surgeon: Jake Bathe, MD;  Location: [facility]  ENDOSCOPY;  Service: Cardiovascular;  Laterality: N/A;   CATARACT EXTRACTION     CHOLECYSTECTOMY     TEE WITHOUT CARDIOVERSION N/A 05/17/2016   Procedure: TRANSESOPHAGEAL ECHOCARDIOGRAM (TEE);  Surgeon: Jake Bathe, MD;  Location: [facility] ENDOSCOPY;  Service: Cardiovascular;  Laterality: N/A;   TOTAL KNEE ARTHROPLASTY Left    Current Outpatient Medications on File Prior to Visit  Medication Sig Dispense Refill   glimepiride (AMARYL) 4 MG tablet Take 4 mg by mouth daily with breakfast.     hydrALAZINE (APRESOLINE) 10 MG tablet Take 1 tablet (10 mg total) by mouth 2 (two) times daily. 180 tablet 3   hydrALAZINE (APRESOLINE) 25 MG tablet Take 1 tablet (25 mg total) by mouth 2 (two) times daily. 180 tablet 3   hydrochlorothiazide (HYDRODIURIL) 25 MG tablet TAKE 1 TABLET BY MOUTH IN  THE MORNING 90 tablet 3   losartan (COZAAR) 100 MG tablet TAKE 1 TABLET BY MOUTH IN  THE MORNING 90 tablet 3   metFORMIN (GLUCOPHAGE) 500 MG tablet Take 2,000 mg by mouth at bedtime.      metoprolol succinate (TOPROL-XL) 100 MG 24 hr tablet Take 1 tablet (100 mg total) by mouth daily. 90 tablet 3   Multiple Vitamin (MULTIVITAMIN WITH MINERALS) TABS tablet Take 1 tablet by mouth daily.      NONFORMULARY OR COMPOUNDED ITEM Apply 1 application topically 4 (four) times daily as needed (right knee pain.). 3%Diclofenac-2%Baclofen-5%Gabapentin-5%Lidocaine-1%Menthol (apply liberally up to 4 times daily for affected joint)     Omega-3 Fatty Acids (FISH OIL) 1200 MG CAPS Take 1,200 mg by  mouth 2 (two) times daily.     ONETOUCH ULTRA test strip      No current facility-administered medications on file prior to visit.    No Known Allergies Social History   Occupational History   Not on file  Tobacco Use   Smoking status: Never   Smokeless tobacco: Never  Vaping Use   Vaping Use: Never used  Substance and Sexual Activity   Alcohol use: No   Drug use: No   Sexual activity: Not on file   Family History  Problem Relation  Age of Onset   Heart failure Mother 46   Cirrhosis Father    Heart attack Son 74   Atrial fibrillation Neg Hx    Immunization History  Administered Date(s) Administered   Fluad Quad(high Dose 65+) 03/22/2019   Influenza, High Dose Seasonal PF 02/16/2017     Review of Systems: Negative except as noted in the HPI.   Objective: There were no vitals filed for this visit.  Alexandra Green is a pleasant 81 y.o. female in NAD. AAO X 3.  Vascular Examination: CFT <3 seconds b/l LE. Faintly palpable DP pulses b/l LE. Diminished PT pulse(s) b/l LE. Pedal hair absent. No pain with calf compression b/l. Trace edema noted BLE. No ischemia or gangrene noted b/l LE. No cyanosis or clubbing noted b/l LE.  Dermatological Examination: Pedal skin is warm and supple b/l LE. No open wounds b/l LE. No interdigital macerations noted b/l LE. Toenails 1-5 b/l elongated, discolored, dystrophic, thickened, crumbly with subungual debris and tenderness to dorsal palpation. Hyperkeratotic lesion(s) bilateral 5th toes.  No erythema, no edema, no drainage, no fluctuance.  Neurological Examination: Pt has subjective symptoms of neuropathy. Protective sensation intact 5/5 intact bilaterally with 10g monofilament b/l. Vibratory sensation diminished b/l.  Musculoskeletal Examination: Normal muscle strength 5/5 to all lower extremity muscle groups bilaterally. Hallux valgus with bunion deformity noted b/l lower extremities. Hammertoe(s) noted to the bilateral 5th toes. Pes planus deformity noted bilateral LE.Marland Kitchen No pain, crepitus or joint limitation noted with ROM b/l LE.  Patient ambulates independently without assistive aids.  Footwear Assessment: Does the patient wear appropriate shoes? Yes. Does the patient need inserts/orthotics? Yes.  ADA Risk Categorization: High Risk  Patient has one or more of the following: Loss of protective sensation Absent pedal pulses Severe Foot deformity History of foot  ulcer  Assessment: 1. Pain due to onychomycosis of toenails of both feet   2. Corns   3. Acquired hammertoes of both feet   4. PAD (peripheral artery disease) (HCC)   5. Diabetic polyneuropathy associated with type 2 diabetes mellitus (HCC)   6. Encounter for diabetic foot exam (HCC)     Plan: -Consent given for treatment as described below: -Diabetic foot examination performed today. -Continue diabetic foot care principles: inspect feet daily, monitor glucose as recommended by PCP and/or Endocrinologist, and follow prescribed diet per PCP, Endocrinologist and/or dietician. -Patient to continue soft, supportive shoe gear daily. -Toenails 1-5 b/l were debrided in length and girth with sterile nail nippers and dremel without iatrogenic bleeding.  -Corn(s) bilateral 5th toes pared utilizing sterile scalpel blade without complication or incident. Total number debrided=2. -Patient/POA to call should there be question/concern in the interim. Return in about 3 months (around 06/01/2022).  Freddie Breech, DPM

## 2022-04-30 DIAGNOSIS — I10 Essential (primary) hypertension: Secondary | ICD-10-CM | POA: Diagnosis not present

## 2022-04-30 DIAGNOSIS — E785 Hyperlipidemia, unspecified: Secondary | ICD-10-CM | POA: Diagnosis not present

## 2022-04-30 DIAGNOSIS — M130 Polyarthritis, unspecified: Secondary | ICD-10-CM | POA: Diagnosis not present

## 2022-04-30 DIAGNOSIS — I5040 Unspecified combined systolic (congestive) and diastolic (congestive) heart failure: Secondary | ICD-10-CM | POA: Diagnosis not present

## 2022-04-30 DIAGNOSIS — I480 Paroxysmal atrial fibrillation: Secondary | ICD-10-CM | POA: Diagnosis not present

## 2022-04-30 DIAGNOSIS — E0800 Diabetes mellitus due to underlying condition with hyperosmolarity without nonketotic hyperglycemic-hyperosmolar coma (NKHHC): Secondary | ICD-10-CM | POA: Diagnosis not present

## 2022-04-30 DIAGNOSIS — R7309 Other abnormal glucose: Secondary | ICD-10-CM | POA: Diagnosis not present

## 2022-04-30 DIAGNOSIS — I110 Hypertensive heart disease with heart failure: Secondary | ICD-10-CM | POA: Diagnosis not present

## 2022-06-11 DIAGNOSIS — I10 Essential (primary) hypertension: Secondary | ICD-10-CM | POA: Diagnosis not present

## 2022-06-11 DIAGNOSIS — E1169 Type 2 diabetes mellitus with other specified complication: Secondary | ICD-10-CM | POA: Diagnosis not present

## 2022-06-11 DIAGNOSIS — R350 Frequency of micturition: Secondary | ICD-10-CM | POA: Diagnosis not present

## 2022-06-12 ENCOUNTER — Other Ambulatory Visit: Payer: Self-pay | Admitting: Cardiology

## 2022-06-12 DIAGNOSIS — I5032 Chronic diastolic (congestive) heart failure: Secondary | ICD-10-CM

## 2022-06-12 DIAGNOSIS — I272 Pulmonary hypertension, unspecified: Secondary | ICD-10-CM

## 2022-06-16 ENCOUNTER — Other Ambulatory Visit: Payer: Self-pay | Admitting: Cardiology

## 2022-06-23 ENCOUNTER — Encounter: Payer: Self-pay | Admitting: Podiatry

## 2022-06-23 ENCOUNTER — Ambulatory Visit: Payer: Medicare Other | Admitting: Podiatry

## 2022-06-23 DIAGNOSIS — L84 Corns and callosities: Secondary | ICD-10-CM

## 2022-06-23 DIAGNOSIS — M79674 Pain in right toe(s): Secondary | ICD-10-CM | POA: Diagnosis not present

## 2022-06-23 DIAGNOSIS — B351 Tinea unguium: Secondary | ICD-10-CM

## 2022-06-23 DIAGNOSIS — I739 Peripheral vascular disease, unspecified: Secondary | ICD-10-CM | POA: Diagnosis not present

## 2022-06-23 DIAGNOSIS — E1142 Type 2 diabetes mellitus with diabetic polyneuropathy: Secondary | ICD-10-CM | POA: Diagnosis not present

## 2022-06-23 DIAGNOSIS — M79675 Pain in left toe(s): Secondary | ICD-10-CM

## 2022-06-23 NOTE — Progress Notes (Signed)
  Subjective:  Patient ID: Alexandra Green, female    DOB: 08/26/1940,  MRN: [id number]  Alexandra Green presents to clinic today for at risk footcare. Patient has h/o diabetes, neuropathy and PAD and is seen for  and painful elongated mycotic toenails 1-5 bilaterally which are tender when wearing enclosed shoe gear. Pain is relieved with periodic professional debridement.  Chief Complaint  Patient presents with   Nail Problem    [facility] BS-136 A1C-5.6 PCP-Bland PCP VST-1 week ago   New problem(s): None.   PCP is [name], MD.  No Known Allergies  Review of Systems: Negative except as noted in the HPI.  Objective: No changes noted in today's physical examination. There were no vitals filed for this visit. Alexandra Green is a pleasant 82 y.o. female WD, WN in NAD. AAO x 3.  Vascular Examination: CFT <3 seconds b/l LE. Faintly palpable DP pulses b/l LE. Diminished PT pulse(s) b/l LE. Pedal hair absent. No pain with calf compression b/l. Trace edema noted BLE. No ischemia or gangrene noted b/l LE. No cyanosis or clubbing noted b/l LE.  Dermatological Examination: Pedal skin is warm and supple b/l LE. No open wounds b/l LE. No interdigital macerations noted b/l LE. Toenails 1-5 b/l elongated, discolored, dystrophic, thickened, crumbly with subungual debris and tenderness to dorsal palpation.   Resolved hyperkeratotic lesion(s) bilateral 5th toes.  No erythema, no edema, no drainage, no fluctuance.  Neurological Examination: Pt has subjective symptoms of neuropathy. Protective sensation intact 5/5 intact bilaterally with 10g monofilament b/l. Vibratory sensation diminished b/l.  Musculoskeletal Examination: Normal muscle strength 5/5 to all lower extremity muscle groups bilaterally. Hallux valgus with bunion deformity noted b/l lower extremities. Hammertoe(s) noted to the bilateral 5th toes. Pes planus deformity noted bilateral LE. No pain, crepitus or joint limitation noted  with ROM b/l LE.  Patient ambulates independently without assistive aids.  Assessment/Plan: 1. Pain due to onychomycosis of toenails of both feet   2. PAD (peripheral artery disease) ([place])   3. Diabetic polyneuropathy associated with type 2 diabetes mellitus ([place])   -Patient was evaluated and treated. All patient's and/or POA's questions/concerns answered on today's visit. -Continue foot and shoe inspections daily. Monitor blood glucose per PCP/Endocrinologist's recommendations. -Continue supportive shoe gear daily. -Mycotic toenails 1-5 bilaterally were debrided in length and girth with sterile nail nippers and dremel without incident. -Patient/POA to call should there be question/concern in the interim.   Return in about 4 months (around 10/23/2022).  [name], DPM

## 2022-08-08 ENCOUNTER — Other Ambulatory Visit: Payer: Self-pay | Admitting: Cardiology

## 2022-08-08 DIAGNOSIS — I272 Pulmonary hypertension, unspecified: Secondary | ICD-10-CM

## 2022-08-08 DIAGNOSIS — I5032 Chronic diastolic (congestive) heart failure: Secondary | ICD-10-CM

## 2022-09-05 ENCOUNTER — Other Ambulatory Visit: Payer: Self-pay | Admitting: Cardiology

## 2022-09-05 DIAGNOSIS — I10 Essential (primary) hypertension: Secondary | ICD-10-CM

## 2022-09-24 DIAGNOSIS — I110 Hypertensive heart disease with heart failure: Secondary | ICD-10-CM | POA: Diagnosis not present

## 2022-09-24 DIAGNOSIS — I739 Peripheral vascular disease, unspecified: Secondary | ICD-10-CM | POA: Diagnosis not present

## 2022-10-17 ENCOUNTER — Other Ambulatory Visit: Payer: Self-pay | Admitting: Cardiology

## 2022-10-17 DIAGNOSIS — I272 Pulmonary hypertension, unspecified: Secondary | ICD-10-CM

## 2022-10-17 DIAGNOSIS — I5032 Chronic diastolic (congestive) heart failure: Secondary | ICD-10-CM

## 2022-11-02 ENCOUNTER — Encounter: Payer: Self-pay | Admitting: Podiatry

## 2022-11-02 ENCOUNTER — Ambulatory Visit: Payer: Medicare Other | Admitting: Podiatry

## 2022-11-02 DIAGNOSIS — M79674 Pain in right toe(s): Secondary | ICD-10-CM | POA: Diagnosis not present

## 2022-11-02 DIAGNOSIS — B351 Tinea unguium: Secondary | ICD-10-CM | POA: Diagnosis not present

## 2022-11-02 DIAGNOSIS — M79675 Pain in left toe(s): Secondary | ICD-10-CM | POA: Diagnosis not present

## 2022-11-02 DIAGNOSIS — L84 Corns and callosities: Secondary | ICD-10-CM | POA: Diagnosis not present

## 2022-11-02 DIAGNOSIS — I739 Peripheral vascular disease, unspecified: Secondary | ICD-10-CM | POA: Diagnosis not present

## 2022-11-02 DIAGNOSIS — E1142 Type 2 diabetes mellitus with diabetic polyneuropathy: Secondary | ICD-10-CM | POA: Diagnosis not present

## 2022-11-07 NOTE — Progress Notes (Signed)
  Subjective:  Patient ID: Alexandra Green, female    DOB: November 14, 1940,  MRN: [id number]  Alexandra Green presents to clinic today for at risk footcare. Patient has h/o diabetes, neuropathy and PAD and is seen for  and corn(s) both feet and painful thick toenails that are difficult to trim. Painful toenails interfere with ambulation. Aggravating factors include wearing enclosed shoe gear. Pain is relieved with periodic professional debridement. Painful corns are aggravated when weightbearing when wearing enclosed shoe gear. Pain is relieved with periodic professional debridement.  Chief Complaint  Patient presents with   Diabetes    [facility] BS - 118 A1C - 6.1 LVPCP - 06/2022   New problem(s): None.   PCP is [name], MD.  No Known Allergies  Review of Systems: Negative except as noted in the HPI.  Objective:  There were no vitals filed for this visit. Alexandra Green is a pleasant 82 y.o. female in NAD. AAO x 3.  Vascular Examination: CFT <3 seconds b/l. DP pulses faintly palpable b/l. PT pulses nonpalpable b/l. Digital hair absent. Skin temperature gradient warm to warm b/l. No pain with calf compression. No ischemia or gangrene. No cyanosis or clubbing noted b/l.    Neurological Examination: Pt has subjective symptoms of neuropathy. Protective sensation decreased with 10 gram monofilament b/l. Vibratory sensation diminished b/l.  Dermatological Examination: Pedal skin warm and supple b/l. No open wounds b/l. No interdigital macerations. Toenails 2-5 b/l thick, discolored, elongated with subungual debris and pain on dorsal palpation.  There is noted onchyolysis of distal 2/3 of hallucal nail plates. The nailbed(s) remain(s) intact. There is no erythema, no edema, no drainage, no underlying fluctuance. Hyperkeratotic lesion(s) bilateral 5th toes.  No erythema, no edema, no drainage, no fluctuance.  Musculoskeletal Examination: Muscle strength 5/5 to all lower extremity muscle  groups bilaterally. HAV with bunion deformity noted b/l LE. Hammertoe(s) noted to the bilateral 5th toes. Pes planus deformity noted bilateral LE.  Radiographs: None  Assessment/Plan: 1. Pain due to onychomycosis of toenails of both feet   2. Corns   3. PAD (peripheral artery disease) (HCC)   4. Diabetic polyneuropathy associated with type 2 diabetes mellitus (HCC)     -Consent given for treatment as described below: -Examined patient. -Continue supportive shoe gear daily. -Toenails were debrided in length and girth 2-5 bilaterally with sterile nail nippers and dremel without iatrogenic bleeding.  -Loose nailplate bilateral great toes gently debrided to level of adherence. Digit cleansed with alcohol. No further treatment required by patient. -Corn(s) bilateral 5th toes pared utilizing sterile scalpel blade without complication or incident. Total number debrided=2. -Patient/POA to call should there be question/concern in the interim.   Return in about 3 months (around 02/02/2023).  Freddie Breech, DPM

## 2022-11-10 NOTE — Progress Notes (Unsigned)
Cardiology Office Note:   Date:  11/11/2022  ID:  [name], DOB [birth year], MRN [id number] PCP: [name], MD  [facility] Providers Cardiologist:  Rollene Rotunda, MD {  History of Present Illness:   Alexandra Green is a 82 y.o. female who presents for follow up of atrial flutter after DCCV. She had a TEE cardioversion and was treated with anticoagulation with Eliquis with CHADS VASC score of 4. Other history includes cardiomyopathy, hypertension, and CHF, with diabetes.  She had ablation by Dr. Elberta Fortis.  Because this was thought to be successful she was taken off of Eliquis.    She thought that she was having atrial flutter.  However, I ordered a monitor and she had no evidence of flutter/fib.  She also had an echo which demonstrated moderately elevated pulmonary pressures. This was in Oct 2021.     I sent her for a repeat echo in June. In 2021 estimated pulmonary pressure was 47.1.  It is slightly increased and moderately elevated now.  There is normal right ventricular systolic function.  She has normal ejection fraction 55 to 60%.    She returns for follow up.    He lives at home with her granddaughter.  She does chores.  Walks with a tripod cane or uses a rolling walker.  She uses a motorized cart in the grocery store. The patient denies any new symptoms such as chest discomfort, neck or arm discomfort. There has been no new shortness of breath, PND or orthopnea. There have been no reported palpitations, presyncope or syncope.  She is really limited by bad knees.    ROS: As stated in the HPI and negative for all other systems.  Studies Reviewed:    EKG:   EKG Interpretation Date/Time:  Thursday November 11 2022 11:08:01 EDT Ventricular Rate:  55 PR Interval:  194 QRS Duration:  110 QT Interval:  430 QTC Calculation: 411 R Axis:   -53  Text Interpretation: Sinus bradycardia with Premature atrial complexes Left anterior fascicular block Moderate voltage criteria for  LVH, may be normal variant ( R in aVL , Cornell product ) Poor anterior R wave progression. QT has shortened Confirmed by Rollene Rotunda ([id number]) on 11/11/2022 11:37:16 AM    Risk Assessment/Calculations:              Physical Exam:   VS:  BP 118/72 (BP Location: Left Arm, Patient Position: Sitting, Cuff Size: Normal)   Pulse (!) 55   Ht 5\' 3"  (1.6 m)   Wt 194 lb (88 kg)   SpO2 97%   BMI 34.37 kg/m    Wt Readings from Last 3 Encounters:  11/11/22 194 lb (88 kg)  10/23/21 196 lb 12.8 oz (89.3 kg)  09/22/20 204 lb 12.8 oz (92.9 kg)     GEN: Well nourished, well developed in no acute distress NECK: No JVD; No carotid bruits CARDIAC: RRR, 3 out of 6 apical holosystolic murmur radiating slightly to the axilla, no diastolic murmurs, rubs, gallops RESPIRATORY:  Clear to auscultation without rales, wheezing or rhonchi  ABDOMEN: Soft, non-tender, non-distended EXTREMITIES:  No edema; No deformity   ASSESSMENT AND PLAN:   Hypertension:   Her blood pressure is at target.  No change in therapy.  Her recent   Atrial flutter.    She has had no symptomatic recurrence of this.  No change in therapy.   Chronic diastolic and systolic CHF:   He seems to be euvolemic.  No change in  therapy.   Cardiomyopathy: She has had a mildly reduced ejection fraction 40 to 45%.   However, this is better at 55% in October 2023.  No further imaging.  MR:   There was mild MR. I will likely follow this up with an echo after her next appointment.   PULMONARY HTN:    This was moderate on echo June 2023.  I do not suspect this is WHO 1.  I will follow this clinically.  I will repeat an echo next year.      Follow up with me in one year.   Signed, Rollene Rotunda, MD

## 2022-11-11 ENCOUNTER — Encounter: Payer: Self-pay | Admitting: Cardiology

## 2022-11-11 ENCOUNTER — Ambulatory Visit: Payer: Medicare Other | Attending: Cardiology | Admitting: Cardiology

## 2022-11-11 VITALS — BP 118/72 | HR 55 | Ht 63.0 in | Wt 194.0 lb

## 2022-11-11 DIAGNOSIS — I272 Pulmonary hypertension, unspecified: Secondary | ICD-10-CM

## 2022-11-11 DIAGNOSIS — I5032 Chronic diastolic (congestive) heart failure: Secondary | ICD-10-CM

## 2022-11-11 DIAGNOSIS — I10 Essential (primary) hypertension: Secondary | ICD-10-CM

## 2022-11-11 DIAGNOSIS — I340 Nonrheumatic mitral (valve) insufficiency: Secondary | ICD-10-CM | POA: Diagnosis not present

## 2022-11-11 MED ORDER — HYDRALAZINE HCL 25 MG PO TABS
25.0000 mg | ORAL_TABLET | Freq: Two times a day (BID) | ORAL | 3 refills | Status: DC
Start: 2022-11-11 — End: 2024-01-10

## 2022-11-11 MED ORDER — LOSARTAN POTASSIUM 100 MG PO TABS: 100.0000 mg | ORAL_TABLET | Freq: Every morning | ORAL | 2 refills | Status: DC

## 2022-11-11 MED ORDER — HYDROCHLOROTHIAZIDE 25 MG PO TABS: 25.0000 mg | ORAL_TABLET | Freq: Every morning | ORAL | 2 refills | Status: DC

## 2022-11-11 MED ORDER — METOPROLOL SUCCINATE ER 100 MG PO TB24: 100.0000 mg | ORAL_TABLET | Freq: Every day | ORAL | 3 refills | Status: DC

## 2022-11-11 MED ORDER — HYDRALAZINE HCL 10 MG PO TABS
10.0000 mg | ORAL_TABLET | Freq: Two times a day (BID) | ORAL | 3 refills | Status: DC
Start: 2022-11-11 — End: 2024-01-10

## 2022-11-11 NOTE — Patient Instructions (Signed)

## 2022-12-31 DIAGNOSIS — E119 Type 2 diabetes mellitus without complications: Secondary | ICD-10-CM | POA: Diagnosis not present

## 2022-12-31 DIAGNOSIS — Z961 Presence of intraocular lens: Secondary | ICD-10-CM | POA: Diagnosis not present

## 2023-01-19 ENCOUNTER — Telehealth: Payer: Self-pay

## 2023-01-19 NOTE — Telephone Encounter (Signed)
   Patient Name: Alexandra Green  DOB: Feb 03, 1941 MRN: [id number]  Primary Cardiologist: Rollene Rotunda, MD  Chart reviewed as part of pre-operative protocol coverage.   Simple dental extractions (i.e. 1-2 teeth), cleanings are considered low risk procedures per guidelines and generally do not require any specific cardiac clearance. It is also generally accepted that for simple extractions and dental cleanings, there is no need to interrupt blood thinner therapy.   SBE prophylaxis is not required for the patient from a cardiac standpoint.  I will route this recommendation to the requesting party via Epic fax function and remove from pre-op pool.  Please call with questions.  Joylene Grapes, NP 01/19/2023, 1:14 PM;

## 2023-01-19 NOTE — Telephone Encounter (Signed)
   Pre-operative Risk Assessment    Patient Name: Alexandra Green  DOB: 1940/06/22 MRN: [id number]     Request for Surgical Clearance    Procedure:   Deep Dental Cleaning  Date of Surgery:  Clearance TBD                                 Surgeon:  Dr. [place] Thomas Surgeon's Group or Practice Name:  [facility] Phone number:  [phone] Fax number:  [phone]   Type of Clearance Requested:   - Medical    Type of Anesthesia:  Local    Additional requests/questions:    Garrel Ridgel   01/19/2023, 10:06 AM

## 2023-02-24 DIAGNOSIS — Z0000 Encounter for general adult medical examination without abnormal findings: Secondary | ICD-10-CM | POA: Diagnosis not present

## 2023-02-24 DIAGNOSIS — I10 Essential (primary) hypertension: Secondary | ICD-10-CM | POA: Diagnosis not present

## 2023-02-24 DIAGNOSIS — M130 Polyarthritis, unspecified: Secondary | ICD-10-CM | POA: Diagnosis not present

## 2023-02-24 DIAGNOSIS — E1169 Type 2 diabetes mellitus with other specified complication: Secondary | ICD-10-CM | POA: Diagnosis not present

## 2023-02-24 DIAGNOSIS — E7800 Pure hypercholesterolemia, unspecified: Secondary | ICD-10-CM | POA: Diagnosis not present

## 2023-03-08 ENCOUNTER — Ambulatory Visit: Payer: Medicare Other | Admitting: Podiatry

## 2023-03-08 ENCOUNTER — Encounter: Payer: Self-pay | Admitting: Podiatry

## 2023-03-08 DIAGNOSIS — E1142 Type 2 diabetes mellitus with diabetic polyneuropathy: Secondary | ICD-10-CM | POA: Diagnosis not present

## 2023-03-08 DIAGNOSIS — M2041 Other hammer toe(s) (acquired), right foot: Secondary | ICD-10-CM | POA: Diagnosis not present

## 2023-03-08 DIAGNOSIS — M79674 Pain in right toe(s): Secondary | ICD-10-CM

## 2023-03-08 DIAGNOSIS — I739 Peripheral vascular disease, unspecified: Secondary | ICD-10-CM | POA: Diagnosis not present

## 2023-03-08 DIAGNOSIS — M79675 Pain in left toe(s): Secondary | ICD-10-CM | POA: Diagnosis not present

## 2023-03-08 DIAGNOSIS — E119 Type 2 diabetes mellitus without complications: Secondary | ICD-10-CM | POA: Diagnosis not present

## 2023-03-08 DIAGNOSIS — B351 Tinea unguium: Secondary | ICD-10-CM

## 2023-03-08 DIAGNOSIS — M2042 Other hammer toe(s) (acquired), left foot: Secondary | ICD-10-CM | POA: Diagnosis not present

## 2023-03-08 DIAGNOSIS — L84 Corns and callosities: Secondary | ICD-10-CM | POA: Diagnosis not present

## 2023-03-08 NOTE — Progress Notes (Signed)
ANNUAL DIABETIC FOOT EXAM  Subjective: Alexandra Green presents today for annual diabetic foot exam.  Chief Complaint  Patient presents with   Diabetes    Patient states that her right 5th toe has been giving her a hard time and has been hurting but other then that everything is good, patient saw her PCP this month , patient states her A1c was 5.6   Patient confirms h/o diabetes.  Patient denies any h/o foot wounds.  Patient has been diagnosed with neuropathy.  Alexandra Rakers, MD is patient's PCP.  Past Medical History:  Diagnosis Date   Arthritis    Atrial fibrillation with RVR (HCC) 05/14/2016   Chronic diastolic HF (heart failure) (HCC) 02/17/2019   Cough 05/31/2018   Diabetes mellitus without complication (HCC)    Hypertension    Non-ischemic cardiomyopathy (HCC) 08/26/2016   Patient Active Problem List   Diagnosis Date Noted   Abnormal weight loss 05/29/2020   Colon cancer screening 05/29/2020   Constipation 05/29/2020   Morbid obesity (HCC) 05/29/2020   Pulmonary HTN (HCC) 03/24/2020   Educated about COVID-19 virus infection 03/21/2019   Chronic diastolic HF (heart failure) (HCC) 02/17/2019   Cough 05/31/2018   Hypertension    Arthritis    Non-ischemic cardiomyopathy (HCC) 08/26/2016   Acute on chronic combined systolic and diastolic CHF (congestive heart failure) (HCC)    New onset atrial fibrillation (HCC) 05/15/2016   Leukocytosis 05/15/2016   Essential hypertension    Diabetes mellitus without complication (HCC)    Atrial fibrillation with RVR (HCC) 05/14/2016   New onset atrial flutter (HCC) 05/14/2016   Past Surgical History:  Procedure Laterality Date   A-FLUTTER ABLATION N/A 05/04/2019   Procedure: A-FLUTTER ABLATION;  Surgeon: Regan Lemming, MD;  [facility];  Service: Cardiovascular;  Laterality: N/A;   ABDOMINAL HYSTERECTOMY     CARDIOVERSION N/A 05/17/2016   Procedure: CARDIOVERSION;  Surgeon: Jake Bathe, MD;   Location: [facility] ENDOSCOPY;  Service: Cardiovascular;  Laterality: N/A;   CATARACT EXTRACTION     CHOLECYSTECTOMY     TEE WITHOUT CARDIOVERSION N/A 05/17/2016   Procedure: TRANSESOPHAGEAL ECHOCARDIOGRAM (TEE);  Surgeon: Jake Bathe, MD;  Location: [facility] ENDOSCOPY;  Service: Cardiovascular;  Laterality: N/A;   TOTAL KNEE ARTHROPLASTY Left    Current Outpatient Medications on File Prior to Visit  Medication Sig Dispense Refill   glimepiride (AMARYL) 4 MG tablet Take 4 mg by mouth daily with breakfast.     hydrALAZINE (APRESOLINE) 10 MG tablet Take 1 tablet (10 mg total) by mouth 2 (two) times daily. 200 tablet 3   hydrALAZINE (APRESOLINE) 25 MG tablet Take 1 tablet (25 mg total) by mouth 2 (two) times daily. 200 tablet 3   hydrochlorothiazide (HYDRODIURIL) 25 MG tablet Take 1 tablet (25 mg total) by mouth every morning. 100 tablet 2   losartan (COZAAR) 100 MG tablet Take 1 tablet (100 mg total) by mouth every morning. 100 tablet 2   metFORMIN (GLUCOPHAGE) 500 MG tablet Take 2,000 mg by mouth at bedtime.      metoprolol succinate (TOPROL-XL) 100 MG 24 hr tablet Take 1 tablet (100 mg total) by mouth daily. 100 tablet 3   Multiple Vitamin (MULTIVITAMIN WITH MINERALS) TABS tablet Take 1 tablet by mouth daily.      NONFORMULARY OR COMPOUNDED ITEM Apply 1 application topically 4 (four) times daily as needed (right knee pain.). 3%Diclofenac-2%Baclofen-5%Gabapentin-5%Lidocaine-1%Menthol (apply liberally up to 4 times daily for affected joint)     Omega-3  Fatty Acids (FISH OIL) 1200 MG CAPS Take 1,200 mg by mouth 2 (two) times daily.     ONETOUCH ULTRA test strip      No current facility-administered medications on file prior to visit.    No Known Allergies Social History   Occupational History   Not on file  Tobacco Use   Smoking status: Never   Smokeless tobacco: Never  Vaping Use   Vaping status: Never Used  Substance and Sexual Activity   Alcohol use: No   Drug use: No   Sexual activity: Not on  file   Family History  Problem Relation Age of Onset   Heart failure Mother 39   Cirrhosis Father    Heart attack Son 4   Atrial fibrillation Neg Hx    Immunization History  Administered Date(s) Administered   Fluad Quad(high Dose 65+) 03/22/2019   Influenza, High Dose Seasonal PF 02/16/2017     Review of Systems: Negative except as noted in the HPI.   Objective: There were no vitals filed for this visit.  Alexandra Green is a pleasant 82 y.o. female in NAD. AAO X 3.  Title   Diabetic Foot Exam - detailed Date & Time: 03/08/2023  1:15 PM Diabetic Foot exam was performed with the following findings: Yes  Visual Foot Exam completed.: Yes  Is there a history of foot ulcer?: No Is there a foot ulcer now?: No Is there swelling?: Yes Is there elevated skin temperature?: No Is there abnormal foot shape?: Yes Is there a claw toe deformity?: No Are the toenails long?: Yes Are the toenails thick?: Yes Are the toenails ingrown?: No Is the skin thin, fragile, shiny and hairless?": No Normal Range of Motion?: Yes Is there foot or ankle muscle weakness?: Yes Do you have pain in calf while walking?: No Are the shoes appropriate in style and fit?: Yes Can the patient see the bottom of their feet?: No Pulse Foot Exam completed.: Yes   Right Posterior Tibialis: Absent Left posterior Tibialis: Absent   Right Dorsalis Pedis: Diminished Left Dorsalis Pedis: Diminished     Sensory Foot Exam Completed.: Yes Semmes-Weinstein Monofilament Test "+" means "has sensation" and "-" means "no sensation"  R Foot Test Control: Neg L Foot Test Control: Neg   R Site 1-Great Toe: Neg L Site 1-Great Toe: Neg   R Site 4: Neg L Site 4: Neg   R site 5: Neg L Site 5: Neg  R Site 6: Neg L Site 6: Neg     Image components are not supported.   Image components are not supported. Image components are not supported.  T[place] Right vibratory: diminished Left vibratory: diminished   Comments Hyperkeratotic lesions dorsal 5th PIPJ.  HAV with bunion b/l. Hammertoes b/l.     Lab Results  Component Value Date   HGBA1C 6.3 (H) 05/15/2016   ADA Risk Categorization: High Risk  Patient has one or more of the following: Loss of protective sensation Absent pedal pulses Severe Foot deformity History of foot ulcer  Assessment: 1. Pain due to onychomycosis of toenails of both feet   2. Corns   3. PAD (peripheral artery disease) (HCC)   4. Acquired hammertoes of both feet   5. Diabetic polyneuropathy associated with type 2 diabetes mellitus (HCC)   6. Encounter for diabetic foot exam [facility])      Plan: -Patient was evaluated today. All questions/concerns addressed on today's visit. -Diabetic foot examination performed today. -Continue diabetic foot care  principles: inspect feet daily, monitor glucose as recommended by PCP and/or Endocrinologist, and follow prescribed diet per PCP, Endocrinologist and/or dietician. -Toenails 1-5 b/l were debrided in length and girth with sterile nail nippers and dremel without iatrogenic bleeding.  -Corn(s) left fifth digit and right fifth digit pared utilizing sterile scalpel blade without complication or incident. Total number debrided=2. -Patient/POA to call should there be question/concern in the interim. Return in about 3 months (around 06/08/2023).  Freddie Breech, DPM      [place] LOCATION: [address], [place] [address]                   Office [phone]   [facility] LOCATION: [address] [place], [place] [address] Office [phone]

## 2023-07-12 ENCOUNTER — Ambulatory Visit (INDEPENDENT_AMBULATORY_CARE_PROVIDER_SITE_OTHER): Payer: Medicare Other | Admitting: Podiatry

## 2023-07-12 ENCOUNTER — Encounter: Payer: Self-pay | Admitting: Podiatry

## 2023-07-12 VITALS — Ht 63.0 in | Wt 194.0 lb

## 2023-07-12 DIAGNOSIS — B351 Tinea unguium: Secondary | ICD-10-CM | POA: Diagnosis not present

## 2023-07-12 DIAGNOSIS — Z1211 Encounter for screening for malignant neoplasm of colon: Secondary | ICD-10-CM | POA: Insufficient documentation

## 2023-07-12 DIAGNOSIS — M79675 Pain in left toe(s): Secondary | ICD-10-CM

## 2023-07-12 DIAGNOSIS — E1142 Type 2 diabetes mellitus with diabetic polyneuropathy: Secondary | ICD-10-CM | POA: Diagnosis not present

## 2023-07-12 DIAGNOSIS — I739 Peripheral vascular disease, unspecified: Secondary | ICD-10-CM | POA: Diagnosis not present

## 2023-07-12 DIAGNOSIS — M79674 Pain in right toe(s): Secondary | ICD-10-CM

## 2023-07-12 DIAGNOSIS — L84 Corns and callosities: Secondary | ICD-10-CM | POA: Diagnosis not present

## 2023-07-12 DIAGNOSIS — K5900 Constipation, unspecified: Secondary | ICD-10-CM | POA: Insufficient documentation

## 2023-07-12 NOTE — Progress Notes (Unsigned)
  Subjective:  Patient ID: Alexandra Green, female    DOB: 03-03-1941,  MRN: [id number]  Alexandra Green presents to clinic today for at risk footcare. Patient has h/o diabetes, neuropathy and PAD and is seen for  and corn(s) right foot and painful thick toenails that are difficult to trim. Painful toenails interfere with ambulation. Aggravating factors include wearing enclosed shoe gear. Pain is relieved with periodic professional debridement. Painful corns are aggravated when weightbearing when wearing enclosed shoe gear. Pain is relieved with periodic professional debridement.  Chief Complaint  Patient presents with   [facility]    She is here for a diabetic nail trim, PCP is dr bland and seen 3 months ago, due this month, Does not remember last A1C   New problem(s): None.   PCP is [name], MD.  No Known Allergies  Review of Systems: Negative except as noted in the HPI.  Objective: No changes noted in today's physical examination. There were no vitals filed for this visit. Alexandra Green is a pleasant 83 y.o. female in NAD. AAO x 3.  Vascular Examination: CFT <3 seconds b/l. DP pulses faintly palpable b/l. PT pulses nonpalpable b/l. Digital hair absent. Skin temperature gradient warm to warm b/l. No pain with calf compression. No ischemia or gangrene. No cyanosis or clubbing noted b/l. Patient wearing compression hose on today's visit. Trace edema noted BLE.   Neurological Examination: Sensation grossly intact b/l with 10 gram monofilament.  Dermatological Examination: Pedal skin warm and supple b/l. No open wounds b/l. No interdigital macerations. Toenails 1-5 b/l thick, discolored, elongated with subungual debris and pain on dorsal palpation.  Porokeratotic lesion(s) R 5th toe. No erythema, no edema, no drainage, no fluctuance.  Musculoskeletal Examination: Muscle strength 5/5 to all lower extremity muscle groups bilaterally. Hammertoe deformity noted 2-5 b/l. Pes planus  deformity noted bilateral LE. Utilizes rollator for ambulation assistance.  Radiographs: None  Assessment/Plan: 1. Pain due to onychomycosis of toenails of both feet   2. Corns   3. PAD (peripheral artery disease) (HCC)   4. Diabetic polyneuropathy associated with type 2 diabetes mellitus (HCC)     No orders of the defined types were placed in this encounter.   None {Jgplan:[id number]::"-Patient/POA to call should there be question/concern in the interim."}   Return in about 4 months (around 11/11/2023).  Freddie Breech, DPM      [place] LOCATION: [address], [place] [address]                   Office [phone]   [facility] LOCATION: [address] [place], [place] [address] Office [phone]

## 2023-07-14 ENCOUNTER — Other Ambulatory Visit: Payer: Self-pay | Admitting: Cardiology

## 2023-07-28 DIAGNOSIS — I10 Essential (primary) hypertension: Secondary | ICD-10-CM | POA: Diagnosis not present

## 2023-07-28 DIAGNOSIS — E1169 Type 2 diabetes mellitus with other specified complication: Secondary | ICD-10-CM | POA: Diagnosis not present

## 2023-07-28 DIAGNOSIS — R634 Abnormal weight loss: Secondary | ICD-10-CM | POA: Diagnosis not present

## 2023-07-28 DIAGNOSIS — I4891 Unspecified atrial fibrillation: Secondary | ICD-10-CM | POA: Diagnosis not present

## 2023-09-22 ENCOUNTER — Other Ambulatory Visit: Payer: Self-pay | Admitting: Cardiology

## 2023-10-27 DIAGNOSIS — R7303 Prediabetes: Secondary | ICD-10-CM | POA: Diagnosis not present

## 2023-10-27 DIAGNOSIS — R1311 Dysphagia, oral phase: Secondary | ICD-10-CM | POA: Diagnosis not present

## 2023-10-27 DIAGNOSIS — R1310 Dysphagia, unspecified: Secondary | ICD-10-CM | POA: Diagnosis not present

## 2023-10-27 DIAGNOSIS — I10 Essential (primary) hypertension: Secondary | ICD-10-CM | POA: Diagnosis not present

## 2023-10-27 DIAGNOSIS — R5383 Other fatigue: Secondary | ICD-10-CM | POA: Diagnosis not present

## 2023-10-27 DIAGNOSIS — R809 Proteinuria, unspecified: Secondary | ICD-10-CM | POA: Diagnosis not present

## 2023-10-27 DIAGNOSIS — E1169 Type 2 diabetes mellitus with other specified complication: Secondary | ICD-10-CM | POA: Diagnosis not present

## 2023-11-09 DIAGNOSIS — I10 Essential (primary) hypertension: Secondary | ICD-10-CM | POA: Diagnosis not present

## 2023-11-09 DIAGNOSIS — I4819 Other persistent atrial fibrillation: Secondary | ICD-10-CM | POA: Diagnosis not present

## 2023-11-09 DIAGNOSIS — E1169 Type 2 diabetes mellitus with other specified complication: Secondary | ICD-10-CM | POA: Diagnosis not present

## 2023-11-09 DIAGNOSIS — R1310 Dysphagia, unspecified: Secondary | ICD-10-CM | POA: Diagnosis not present

## 2023-11-09 DIAGNOSIS — M130 Polyarthritis, unspecified: Secondary | ICD-10-CM | POA: Diagnosis not present

## 2023-11-15 ENCOUNTER — Encounter: Payer: Self-pay | Admitting: Podiatry

## 2023-11-15 ENCOUNTER — Ambulatory Visit: Admitting: Podiatry

## 2023-11-15 DIAGNOSIS — B351 Tinea unguium: Secondary | ICD-10-CM | POA: Diagnosis not present

## 2023-11-15 DIAGNOSIS — I739 Peripheral vascular disease, unspecified: Secondary | ICD-10-CM | POA: Diagnosis not present

## 2023-11-15 DIAGNOSIS — M79675 Pain in left toe(s): Secondary | ICD-10-CM

## 2023-11-15 DIAGNOSIS — E1142 Type 2 diabetes mellitus with diabetic polyneuropathy: Secondary | ICD-10-CM | POA: Diagnosis not present

## 2023-11-15 DIAGNOSIS — M79674 Pain in right toe(s): Secondary | ICD-10-CM

## 2023-11-18 NOTE — Progress Notes (Signed)
  Subjective:  Patient ID: Alexandra Green, female    DOB: [birth year],  MRN: [id number]  Alexandra Green presents to clinic today for at risk footcare. Patient has h/o diabetes, neuropathy and PAD and is seen for  and corn(s) b/l feet and painful mycotic toenails that are difficult to trim. Painful toenails interfere with ambulation. Aggravating factors include wearing enclosed shoe gear. Pain is relieved with periodic professional debridement. Painful corns are aggravated when weightbearing when wearing enclosed shoe gear. Pain is relieved with periodic professional debridement.  Chief Complaint  Patient presents with   Nail Problem    Thick painful toenails, 3 month follow up    New problem(s): None.   PCP is Benjamine Aland, MD. Alexandra Green 02/14/2023.  No Known Allergies  Review of Systems: Negative except as noted in the HPI.  Objective: No changes noted in today's physical examination. There were no vitals filed for this visit. Alexandra Green is a pleasant 83 y.o. female in NAD. AAO x 3.  Vascular Examination: CFT <3 seconds b/l. DP pulses faintly palpable b/l. PT pulses nonpalpable b/l. Digital hair absent. Skin temperature gradient warm to warm b/l. No pain with calf compression. No ischemia or gangrene. No cyanosis or clubbing noted b/l. Dependent edema noted b/l LE.   Neurological Examination: Sensation grossly intact b/l with 10 gram monofilament.   Dermatological Examination: Pedal skin warm and supple b/l. No open wounds b/l. No interdigital macerations. Toenails 1-5 b/l thick, discolored, elongated with subungual debris and pain on dorsal palpation. No hyperkeratotic nor porokeratotic lesions.  Musculoskeletal Examination: Muscle strength 5/5 to all lower extremity muscle groups bilaterally. No pain, crepitus or joint limitation noted with ROM b/l LE. Hammertoe(s) 2-5 b/l. Pes planus deformity noted bilateral LE.Alexandra Green Patient ambulates with rollator assistance.  Radiographs:  None  Assessment/Plan: 1. Pain due to onychomycosis of toenails of both feet   2. PAD (peripheral artery disease) (HCC)   3. Diabetic polyneuropathy associated with type 2 diabetes mellitus (HCC)   Patient was evaluated and treated. All patient's and/or POA's questions/concerns addressed on today's visit. Toenails 1-5 debrided in length and girth without incident. Continue foot and shoe inspections daily. Monitor blood glucose per PCP/Endocrinologist's recommendations. Continue soft, supportive shoe gear daily. Report any pedal injuries to medical professional. Call office if there are any questions/concerns. -Patient/POA to call should there be question/concern in the interim.   Return in about 3 months (around 02/15/2024).  Alexandra Green, DPM      [place] LOCATION: [address], [place] [address]                   Office [phone]   [facility] LOCATION: [address] [place], [place] [address] Office [phone]

## 2023-12-05 ENCOUNTER — Other Ambulatory Visit: Payer: Self-pay | Admitting: Cardiology

## 2023-12-27 ENCOUNTER — Encounter: Payer: Self-pay | Admitting: Gastroenterology

## 2023-12-27 ENCOUNTER — Other Ambulatory Visit (HOSPITAL_COMMUNITY): Payer: Self-pay | Admitting: *Deleted

## 2023-12-27 ENCOUNTER — Ambulatory Visit: Admitting: Gastroenterology

## 2023-12-27 VITALS — BP 150/80 | HR 80 | Ht 61.0 in | Wt 185.4 lb

## 2023-12-27 DIAGNOSIS — R634 Abnormal weight loss: Secondary | ICD-10-CM

## 2023-12-27 DIAGNOSIS — K219 Gastro-esophageal reflux disease without esophagitis: Secondary | ICD-10-CM | POA: Diagnosis not present

## 2023-12-27 DIAGNOSIS — K529 Noninfective gastroenteritis and colitis, unspecified: Secondary | ICD-10-CM

## 2023-12-27 DIAGNOSIS — R1312 Dysphagia, oropharyngeal phase: Secondary | ICD-10-CM | POA: Diagnosis not present

## 2023-12-27 DIAGNOSIS — R490 Dysphonia: Secondary | ICD-10-CM | POA: Diagnosis not present

## 2023-12-27 DIAGNOSIS — K2289 Other specified disease of esophagus: Secondary | ICD-10-CM

## 2023-12-27 DIAGNOSIS — R1310 Dysphagia, unspecified: Secondary | ICD-10-CM

## 2023-12-27 MED ORDER — FAMOTIDINE 20 MG PO TABS: 20.0000 mg | ORAL_TABLET | Freq: Two times a day (BID) | ORAL | 2 refills | Status: AC

## 2023-12-27 NOTE — Progress Notes (Signed)
 Chief Complaint:dysphagia Primary GI Doctor: Dr. Charlanne  HPI:  Patient is a  83  year old female patient with past medical history of Atrial flutter, mild MR, cardiomyopathy, hypertension, CHF, with diabetes, who was referred to me by Benjamine Aland, MD on 11/02/23 for a evaluation of dysphagia .    11/11/22 Last seen by cardiology, reviewed entire note.  Interval History  Patient reports issues with oropharyngeal dysphagia with both solids and liquids. She reports she will drink liquids and it comes back up her nose. She notes meat feels like it gets stuck in back of throat and will need to drink liquid to get it to go down. She will also have coughing with eating.  She denies heartburn. She does note hoarseness and globus sensation. Not currently on antiacids. She notes decrease in appetite and does not feel hungry. She has lost 9lbs since April. No history of strokes.   She notes she history of loose stools and urgency for several years. She has 1 bowel movement daily. No blood in stool. Denies abdominal pain.  She has never had EGD.   She had colonoscopy several years ago with Dr. Kristie. She is unsure what findings were.  Uses walker and lives with her granddaughter.   Nonsmoker. No alcohol use.   Surgical history: left knee replacement, hysterectomy, gallbladder  Patient's family history:  No colon CA, no esophageal or stomach CA  Wt Readings from Last 3 Encounters:  12/27/23 185 lb 6 oz (84.1 kg)  07/12/23 194 lb (88 kg)  11/11/22 194 lb (88 kg)     Past Medical History:  Diagnosis Date   Arthritis    Atrial fibrillation with RVR (HCC) 05/14/2016   CAD (coronary artery disease)    Chronic diastolic HF (heart failure) (HCC) 02/17/2019   Cough 05/31/2018   Diabetes mellitus without complication (HCC)    Gallstones    Hypertension    Non-ischemic cardiomyopathy (HCC) 08/26/2016    Past Surgical History:  Procedure Laterality Date   A-FLUTTER ABLATION N/A 05/04/2019    Procedure: A-FLUTTER ABLATION;  Surgeon: Inocencio Soyla Lunger, MD;  Location: [facility] INVASIVE CV LAB;  Service: Cardiovascular;  Laterality: N/A;   ABDOMINAL HYSTERECTOMY     CARDIOVERSION N/A 05/17/2016   Procedure: CARDIOVERSION;  Surgeon: Oneil JAYSON Parchment, MD;  Location: [facility] ENDOSCOPY;  Service: Cardiovascular;  Laterality: N/A;   CATARACT EXTRACTION Bilateral    CHOLECYSTECTOMY     TEE WITHOUT CARDIOVERSION N/A 05/17/2016   Procedure: TRANSESOPHAGEAL ECHOCARDIOGRAM (TEE);  Surgeon: Oneil JAYSON Parchment, MD;  Location: [facility] ENDOSCOPY;  Service: Cardiovascular;  Laterality: N/A;   TOTAL KNEE ARTHROPLASTY Left     Current Outpatient Medications  Medication Sig Dispense Refill   glimepiride (AMARYL) 4 MG tablet Take 4 mg by mouth daily with breakfast.     hydrALAZINE  (APRESOLINE ) 10 MG tablet Take 1 tablet (10 mg total) by mouth 2 (two) times daily. 200 tablet 3   hydrALAZINE  (APRESOLINE ) 25 MG tablet Take 1 tablet (25 mg total) by mouth 2 (two) times daily. 200 tablet 3   hydrochlorothiazide  (HYDRODIURIL ) 25 MG tablet TAKE 1 TABLET BY MOUTH EVERY  MORNING 30 tablet 0   losartan  (COZAAR ) 100 MG tablet TAKE 1 TABLET BY MOUTH EVERY  MORNING 30 tablet 0   metFORMIN  (GLUCOPHAGE ) 500 MG tablet Take 2,000 mg by mouth at bedtime.      metoprolol  succinate (TOPROL -XL) 100 MG 24 hr tablet Take 1 tablet (100 mg total) by mouth daily. 100 tablet 3  Multiple Vitamin (MULTIVITAMIN WITH MINERALS) TABS tablet Take 1 tablet by mouth daily.      Omega-3 Fatty Acids (FISH OIL) 1200 MG CAPS Take 1,200 mg by mouth 2 (two) times daily.     ONETOUCH ULTRA test strip      No current facility-administered medications for this visit.    Allergies as of 12/27/2023   (No Known Allergies)    Family History  Problem Relation Age of Onset   Heart failure Mother 67   Diabetes Mother    Cirrhosis Father    Diabetes Sister    Prostate cancer Brother    Heart attack Son 47   Diabetes Son    Arthritis Son    Atrial  fibrillation Neg Hx     Review of Systems:    Constitutional: No weight loss, fever, chills, weakness or fatigue HEENT: Eyes: No change in vision               Ears, Nose, Throat:  No change in hearing or congestion Skin: No rash or itching Cardiovascular: No chest pain, chest pressure or palpitations   Respiratory: No SOB or cough Gastrointestinal: See HPI and otherwise negative Genitourinary: No dysuria or change in urinary frequency Neurological: No headache, dizziness or syncope Musculoskeletal: No new muscle or joint pain Hematologic: No bleeding or bruising Psychiatric: No history of depression or anxiety    Physical Exam:  Vital signs: BP (!) 150/80 (BP Location: Left Arm, Patient Position: Sitting, Cuff Size: Normal)   Pulse 80   Ht 5' 1 (1.549 m)   Wt 185 lb 6 oz (84.1 kg)   BMI 35.03 kg/m   Constitutional:   Pleasant female  appears to be in NAD, Well developed, Well nourished, alert and cooperative Throat: Oral cavity and pharynx without inflammation, swelling or lesion.  Respiratory: Respirations even and unlabored. Lungs clear to auscultation bilaterally.   No wheezes, crackles, or rhonchi.  Cardiovascular: Normal S1, S2. Regular rate and rhythm. No peripheral edema, cyanosis or pallor.  Gastrointestinal:  Soft, nondistended, nontender. No rebound or guarding. Hypoactive bowel sounds. No appreciable masses or hepatomegaly. Rectal:  Not performed.  Msk: uses walker Neurologic:  Alert and  oriented x4;  grossly normal neurologically.  Skin:   Dry and intact without significant lesions or rashes.  RELEVANT LABS AND IMAGING: CBC    Latest Ref Rng & Units 05/04/2019    9:09 AM 02/21/2018   11:00 AM 05/18/2016    4:24 AM  CBC  WBC 4.0 - 10.5 K/uL 9.4  10.9  10.9   Hemoglobin 12.0 - 15.0 g/dL 86.4  86.3  87.9   Hematocrit 36.0 - 46.0 % 42.0  42.9  37.8   Platelets 150 - 400 K/uL 290  296  327      CMP     Latest Ref Rng & Units 05/04/2019    9:09 AM 02/21/2018    11:00 AM 05/18/2016    4:24 AM  CMP  Glucose 70 - 99 mg/dL 892  881  895   BUN 8 - 23 mg/dL 12  17  19    Creatinine 0.44 - 1.00 mg/dL 9.03  9.06  9.18   Sodium 135 - 145 mmol/L 143  139  141   Potassium 3.5 - 5.1 mmol/L 3.0  4.0  4.0   Chloride 98 - 111 mmol/L 102  101  101   CO2 22 - 32 mmol/L 28  30  28    Calcium 8.9 - 10.3 mg/dL 8.6  8.8  8.9   Total Protein 6.5 - 8.1 g/dL   6.1   Total Bilirubin 0.3 - 1.2 mg/dL   0.6   Alkaline Phos 38 - 126 U/L   53   AST 15 - 41 U/L   19   ALT 14 - 54 U/L   21      Lab Results  Component Value Date   TSH 1.711 05/14/2016  10/28/23 labs show: CRP 3.3, Ha1c 5.8, plt 377, hgb 13.9, wbc 10.8, normal lfts, BUN14, creat 0.67   09/2021 echo- Left ventricular ejection fraction, by estimation, is 55 to 60%.   08/2018 ESOPHOGRAM/BARIUM SWALLOW  IMPRESSION: 1. Mildly limited, single-contrast exam performed as detailed above. 2. Moderate esophageal dysmotility, likely presbyesophagus. 3. No evidence of dominant esophageal stricture or persistent narrowing. 4. Delayed passage of a barium tablet, presumably due to dysmotility.   Assessment: Encounter Diagnoses  Name Primary?   Oropharyngeal dysphagia Yes   Presbyesophagus    Laryngopharyngeal reflux (LPR)    Hoarseness    Loss of weight    Chronic diarrhea      83 year old female patient that presents with chronic dysphagia.  Did review her records and she did have barium swallow back in [place] 2020 that showed esophageal dysmotility likely presbyesophagus with no stricture.  Patient reports she feels as if her symptoms have worsened and the hoarseness is new so we will go ahead and order modified barium swallow study with tablet to rule out stricture or web.  Patient denies symptoms of reflux.  The hoarseness Kwabena Strutz or Rhiley Solem not be related to LPR and we will go ahead and start patient on famotidine  twice daily.  Also discussed GERD diet and no late meals.    Patient has had recent weight loss and poor  appetite over the course of the last 5 months.  Patient admits she is eating less but I suspect Yvonda Fouty be partially due to the oropharyngeal dysphagia.  Will go ahead and order CT scan to rule out any malignancy.  We also discussed proceeding with a upper GI endoscopy however patient would like to hold off on any invasive procedures for now.  Will revisit at follow-up.    Patient has also had issues with chronic loose stools and incontinence.  We will initially start psyllium husk fiber to see if this helps soak up the looseness of the stools.  Plan: - Reinforced GERD diet, no late meals 3-4 hours before lying down - Start famotidine  20 mg twice daily  -Order MBSS with tablet -OTC psyllium husk 1 tsp po daily  - Order CT scan abdomen/pelvis for weight loss  Thank you for the courtesy of this consult. Please call me with any questions or concerns.   Matheo Rathbone, FNP-C [facility] 12/27/2023, 11:29 AM  Cc: Benjamine Aland, MD

## 2023-12-27 NOTE — Patient Instructions (Signed)
 Diarrhea Can add psyllium husk 1 tsp po daily   GERD Recommend GERD diet Will start patient on famotidine  20 mg twice daily  Presbyesophagus  Dysphagia precautions   You have been scheduled for a CT scan of the abdomen and pelvis at [facility], 1st floor Radiology. You are scheduled on 01/10/24 at 9:30am. You should arrive 15 minutes prior to your appointment time for registration.    You may take any medications as prescribed with a small amount of water, if necessary. If you take any of the following medications: METFORMIN , GLUCOPHAGE , GLUCOVANCE, AVANDAMET, RIOMET , FORTAMET , ACTOPLUS MET, JANUMET, GLUMETZA  or METAGLIP, you MAY be asked to HOLD this medication 48 hours AFTER the exam.   The purpose of you drinking the oral contrast is to aid in the visualization of your intestinal tract. The contrast solution may cause some diarrhea. Depending on your individual set of symptoms, you may also receive an intravenous injection of x-ray contrast/dye. Plan on being at [facility] for 45 minutes or longer, depending on the type of exam you are having performed.   If you have any questions regarding your exam or if you need to reschedule, you may call [facility] at [phone] between the hours of 8:00 am and 5:00 pm, Monday-Friday.      You have been scheduled for a modified barium swallow on 01/10/24 at 1:00pm. Please arrive 30 minutes prior to your test for registration. You will go to [facility] Radiology (1st Floor) for your appointment. Should you need to cancel or reschedule your appointment, please contact [phone] [place] [place]) or [phone] Geroge Long). _____________________________________________________________________ A Modified Barium Swallow Study, or MBS, is a special x-ray that is taken to check swallowing skills. It is carried out by a Marine scientist and a Warehouse manager (SLP). During this test, yourmouth, throat, and esophagus, a  muscular tube which connects your mouth to your stomach, is checked. The test will help you, your doctor, and the SLP plan what types of foods and liquids are easier for you to swallow. The SLP will also identify positions and ways to help you swallow more easily and safely. What will happen during an MBS? You will be taken to an x-ray room and seated comfortably. You will be asked to swallow small amounts of food and liquid mixed with barium. Barium is a liquid or paste that allows images of your mouth, throat and esophagus to be seen on x-ray. The x-ray captures moving images of the food you are swallowing as it travels from your mouth through your throat and into your esophagus. This test helps identify whether food or liquid is entering your lungs (aspiration). The test also shows which part of your mouth or throat lacks strength or coordination to move the food or liquid in the right direction. This test typically takes 30 minutes to 1 hour to complete. _______________________________________________________________________   _______________________________________________________  If your blood pressure at your visit was 140/90 or greater, please contact your primary care physician to follow up on this.  _______________________________________________________  If you are age 83 or older, your body mass index should be between 23-30. Your Body mass index is 35.03 kg/m. If this is out of the aforementioned range listed, please consider follow up with your Primary Care Provider.  If you are age 88 or younger, your body mass index should be between 19-25. Your Body mass index is 35.03 kg/m. If this is out of the aformentioned range listed, please consider follow  up with your Primary Care Provider.   ________________________________________________________  The [facility] providers would like to encourage you to use MYCHART to communicate with providers for non-urgent requests or questions.   Due to long hold times on the telephone, sending your provider a message by [facility] may be a faster and more efficient way to get a response.  Please allow 48 business hours for a response.  Please remember that this is for non-urgent requests.  _______________________________________________________  [facility] is using a team-based approach to care.  Your team is made up of your doctor and two to three APPS. Our APPS (Nurse Practitioners and Physician Assistants) work with your physician to ensure care continuity for you. They are fully qualified to address your health concerns and develop a treatment plan. They communicate directly with your gastroenterologist to care for you. Seeing the Advanced Practice Practitioners on your physician's team can help you by facilitating care more promptly, often allowing for earlier appointments, access to diagnostic testing, procedures, and other specialty referrals.   Thank you for trusting me with your gastrointestinal care. Deanna May, FNP-C

## 2024-01-07 ENCOUNTER — Other Ambulatory Visit: Payer: Self-pay | Admitting: Cardiology

## 2024-01-07 DIAGNOSIS — I10 Essential (primary) hypertension: Secondary | ICD-10-CM

## 2024-01-07 DIAGNOSIS — I272 Pulmonary hypertension, unspecified: Secondary | ICD-10-CM

## 2024-01-07 DIAGNOSIS — I5032 Chronic diastolic (congestive) heart failure: Secondary | ICD-10-CM

## 2024-01-10 ENCOUNTER — Encounter (HOSPITAL_COMMUNITY): Payer: Self-pay

## 2024-01-10 ENCOUNTER — Ambulatory Visit (HOSPITAL_COMMUNITY)
Admission: RE | Admit: 2024-01-10 | Discharge: 2024-01-10 | Disposition: A | Discharge status: Home / Self Care | Source: Ambulatory Visit | Attending: Gastroenterology | Admitting: Gastroenterology

## 2024-01-10 ENCOUNTER — Ambulatory Visit (HOSPITAL_COMMUNITY)
Admission: RE | Admit: 2024-01-10 | Discharge: 2024-01-10 | Disposition: A | Source: Ambulatory Visit | Attending: Family Medicine

## 2024-01-10 ENCOUNTER — Ambulatory Visit (HOSPITAL_COMMUNITY)
Admission: RE | Admit: 2024-01-10 | Discharge: 2024-01-10 | Disposition: A | Source: Ambulatory Visit | Attending: Family Medicine | Admitting: Family Medicine

## 2024-01-10 DIAGNOSIS — K2289 Other specified disease of esophagus: Secondary | ICD-10-CM

## 2024-01-10 DIAGNOSIS — R490 Dysphonia: Secondary | ICD-10-CM | POA: Diagnosis not present

## 2024-01-10 DIAGNOSIS — K439 Ventral hernia without obstruction or gangrene: Secondary | ICD-10-CM | POA: Diagnosis not present

## 2024-01-10 DIAGNOSIS — R1312 Dysphagia, oropharyngeal phase: Secondary | ICD-10-CM | POA: Diagnosis not present

## 2024-01-10 DIAGNOSIS — K529 Noninfective gastroenteritis and colitis, unspecified: Secondary | ICD-10-CM | POA: Insufficient documentation

## 2024-01-10 DIAGNOSIS — R1310 Dysphagia, unspecified: Secondary | ICD-10-CM

## 2024-01-10 DIAGNOSIS — E119 Type 2 diabetes mellitus without complications: Secondary | ICD-10-CM | POA: Insufficient documentation

## 2024-01-10 DIAGNOSIS — K219 Gastro-esophageal reflux disease without esophagitis: Secondary | ICD-10-CM | POA: Diagnosis not present

## 2024-01-10 DIAGNOSIS — N281 Cyst of kidney, acquired: Secondary | ICD-10-CM | POA: Diagnosis not present

## 2024-01-10 DIAGNOSIS — R634 Abnormal weight loss: Secondary | ICD-10-CM | POA: Diagnosis not present

## 2024-01-10 DIAGNOSIS — K5730 Diverticulosis of large intestine without perforation or abscess without bleeding: Secondary | ICD-10-CM | POA: Diagnosis not present

## 2024-01-10 DIAGNOSIS — N289 Disorder of kidney and ureter, unspecified: Secondary | ICD-10-CM | POA: Diagnosis not present

## 2024-01-10 LAB — POCT I-STAT CREATININE: Creatinine, Ser: 0.7 mg/dL (ref 0.44–1.00)

## 2024-01-10 MED ORDER — IOHEXOL 300 MG/ML  SOLN
100.0000 mL | Freq: Once | INTRAMUSCULAR | Status: AC | PRN
  Administered 2024-01-10: 100 mL via INTRAVENOUS

## 2024-01-10 NOTE — Progress Notes (Signed)
 Modified Barium Swallow Study  Patient Details  Name: KERRIGAN GOMBOS MRN: [id number] Date of Birth: 1940-09-05  Today's Date: 01/10/2024  Modified Barium Swallow completed.  Full report located under Chart Review in the Imaging Section.  History of Present Illness Patient is an 83 y.o. female with past medical history of Atrial flutter, mild MR, cardiomyopathy, hypertension, CHF, with diabetes. Per chart review, patient had MBS in May 2020 that showed esophageal dysmotility like presbyesophagus with no stricture. Patient had GI appointment on 12/27/23 where she reported having issues with oropharyngeal dysphagia with both solids and liquids. Patient reported that when she drinks liquids it comes back up her nose. She noted that meat feels like it gets stuck in back of throat and will need to drink liquid to get it down. Reported having coughing with eating. She denied any heartburn. Patient noted hoarseness and globus sensation. She noted a decrease in appetite (she reported loss of 9 pounds since April) and shared that she does not feel hungry. MBS ordered to rule out aspiration and evaluate swallow function.   Clinical Impression (P) Patient presents with an oropharygneal phase dysphagia. No aspiration was observed. Penetration occured above the vocal folds with thin liquids. Findings are consistent with patient's reports of nasal regurgation of liquids as well as globous sensation of both liquids and solids. Although nasal regurgation not observed, collection of moderate amount of pharyngeal residuals observed which could result in nasal or oral regurgation. Oral impairments include lingual residuals as well as swallow initiation in the valleculae. Pharyngeal impairments include a narrow column of air in laryngeal vestibule, diminished pharyngeal swallow, unilateral bulging (in AP view) in pharyngeal contraction, a partial obstruction of flow in PES, and the majority of residuals on the pyriform  sinuses and valleculae. SLP suspects combination of cervical osteophytes (confirmed by radiologist) and cricopharyngeal bar are impacting bolbus transit through PES. SLP recommending small meals with the focus more on liquids using small bites/sips, slow rate, and using multiple dry swallows after each bite or sip. SLP recommending consider ENT consultation due to patient's change in vocal quality. Recommending outpatient SLP to focus on strategies to mitigate patient's pharyngeal dysphagia.  DIGEST Swallow Severity Rating*  Safety: 1  Efficiency:3  Overall Pharyngeal Swallow Severity: 2 (moderate) 1: mild; 2: moderate; 3: severe; 4: profound  *The Dynamic Imaging Grade of Swallowing Toxicity is standardized for the head and neck cancer population, however, demonstrates promising clinical applications across populations to standardize the clinical rating of pharyngeal swallow safety and severity.   Swallow Evaluation Recommendations Recommendations: PO diet PO Diet Recommendation:  (Solids as tolerated. Focus nutrition on liquids.) Liquid Administration via: Cup;Straw Medication Administration: Crushed with puree Supervision: Patient able to self-feed Swallowing strategies  : Slow rate;Small bites/sips;Multiple dry swallows after each bite/sip Postural changes: Position pt fully upright for meals;Stay upright 30-60 min after meals Oral care recommendations: Oral care BID (2x/day) Recommended consults: Consider GI consultation;Consider ENT consultation    Damien Hy  Graduate SLP Clinican

## 2024-01-25 ENCOUNTER — Ambulatory Visit: Payer: Self-pay | Admitting: Gastroenterology

## 2024-01-25 ENCOUNTER — Telehealth: Payer: Self-pay | Admitting: Gastroenterology

## 2024-01-25 NOTE — Telephone Encounter (Signed)
 Attempted to reach patient to discuss imaging and swallow study results.  No answer left voicemail

## 2024-01-25 NOTE — Telephone Encounter (Signed)
PT returning call to discuss results. Please advise

## 2024-01-30 ENCOUNTER — Other Ambulatory Visit: Payer: Self-pay | Admitting: Family Medicine

## 2024-01-30 ENCOUNTER — Other Ambulatory Visit: Payer: Self-pay

## 2024-01-30 DIAGNOSIS — N2889 Other specified disorders of kidney and ureter: Secondary | ICD-10-CM

## 2024-02-01 ENCOUNTER — Other Ambulatory Visit: Payer: Self-pay

## 2024-02-01 DIAGNOSIS — N289 Disorder of kidney and ureter, unspecified: Secondary | ICD-10-CM

## 2024-02-07 ENCOUNTER — Other Ambulatory Visit: Payer: Self-pay | Admitting: Cardiology

## 2024-02-07 DIAGNOSIS — I10 Essential (primary) hypertension: Secondary | ICD-10-CM

## 2024-02-07 DIAGNOSIS — I5032 Chronic diastolic (congestive) heart failure: Secondary | ICD-10-CM

## 2024-02-07 DIAGNOSIS — I272 Pulmonary hypertension, unspecified: Secondary | ICD-10-CM

## 2024-02-14 DIAGNOSIS — I340 Nonrheumatic mitral (valve) insufficiency: Secondary | ICD-10-CM | POA: Insufficient documentation

## 2024-02-14 NOTE — Progress Notes (Unsigned)
 Cardiology Office Note:   Date:  02/16/2024  ID:  Alexandra Green, DOB [birth year], MRN [id number] PCP: Benjamine Aland, MD  [facility] Providers Cardiologist:  Lynwood Schilling, MD {  History of Present Illness:   Alexandra Green is a 83 y.o. female who presents for follow up of atrial flutter after DCCV. She had a TEE cardioversion and was treated with anticoagulation with Eliquis  with CHADS VASC score of 4. Other history includes cardiomyopathy, hypertension, and CHF, with diabetes.  She had ablation by Dr. Inocencio.  Because this was thought to be successful she was taken off of Eliquis .    She thought that she was having atrial flutter.  However, I ordered a monitor and she had no evidence of flutter/fib.  She also had an echo which demonstrated moderately elevated pulmonary pressures. This was in Oct 2021.     I sent her for a repeat echo in June. In 2021 estimated pulmonary pressure was 47.1.  It is slightly increased and moderately elevated now.  There is normal right ventricular systolic function.  She has normal ejection fraction 55 to 60%.    She returns for follow up.    Since I last saw her she has had no new cardiovascular problems. The patient denies any new symptoms such as chest discomfort, neck or arm discomfort. There has been no new shortness of breath, PND or orthopnea. There have been no reported palpitations, presyncope or syncope.  She lives at home with her granddaughter.  She gets around very slowly with a walker or cane.    ROS: As stated in the HPI and negative for all other systems.  Studies Reviewed:    EKG:   EKG Interpretation Date/Time:  Thursday February 16 2024 16:28:45 EST Ventricular Rate:  54 PR Interval:  196 QRS Duration:  114 QT Interval:  426 QTC Calculation: 403 R Axis:   -34  Text Interpretation: Sinus bradycardia Left axis deviation Moderate voltage criteria for LVH, may be normal variant ( R in aVL , Cornell product ) When compared with  ECG of 11-Nov-2022 11:08, No significant change since last tracing Confirmed by Schilling Lynwood ([id number]) on 02/16/2024 4:36:22 PM    Risk Assessment/Calculations:     Physical Exam:   VS:  BP (!) 160/82   Pulse (!) 54   Ht 5' 4 (1.626 m)   Wt 186 lb 3.2 oz (84.5 kg)   SpO2 98%   BMI 31.96 kg/m    Wt Readings from Last 3 Encounters:  02/16/24 186 lb 3.2 oz (84.5 kg)  12/27/23 185 lb 6 oz (84.1 kg)  07/12/23 194 lb (88 kg)     GEN: Well nourished, well developed in no acute distress NECK: No JVD; No carotid bruits CARDIAC: R 3 out of 6 holosystolic murmur heard at the RR, apex and into the axilla, no diastolic murmurs, rubs, gallops RESPIRATORY:  Clear to auscultation without rales, wheezing or rhonchi  ABDOMEN: Soft, non-tender, non-distended EXTREMITIES:  No edema; No deformity   ASSESSMENT AND PLAN:   Hypertension:   Her blood pressure is not controlled.  I am going to increase her hydralazine  to 50 mg twice daily and give her 10 mg PRNs.    Atrial flutter.    She has had no symptomatic recurrence of this.  No change in therapy.   Chronic diastolic and systolic CHF:   She seems to be euvolemic.  No change in therapy.   Cardiomyopathy: This was 55% up from 40  to 45%.   She will continue the meds as listed.    MR:   There was mild MR in 2022.  We had a long conversation about this.  She probably would not consider a MitraClip and would not be a candidate for surgical repair.  However, she agrees to let me do another echocardiogram to further evaluate this so we can make more informed decisions.   PULMONARY HTN:   This is not WHO type I.  I will follow this with the echo as above.  This is probably WHO type II and III  Follow up with me in 1 year  Signed, Lynwood Schilling, MD

## 2024-02-16 ENCOUNTER — Encounter: Payer: Self-pay | Admitting: Cardiology

## 2024-02-16 ENCOUNTER — Ambulatory Visit: Attending: Cardiology | Admitting: Cardiology

## 2024-02-16 VITALS — BP 160/82 | HR 54 | Ht 64.0 in | Wt 186.2 lb

## 2024-02-16 DIAGNOSIS — I272 Pulmonary hypertension, unspecified: Secondary | ICD-10-CM

## 2024-02-16 DIAGNOSIS — I10 Essential (primary) hypertension: Secondary | ICD-10-CM

## 2024-02-16 DIAGNOSIS — I4892 Unspecified atrial flutter: Secondary | ICD-10-CM

## 2024-02-16 DIAGNOSIS — I340 Nonrheumatic mitral (valve) insufficiency: Secondary | ICD-10-CM

## 2024-02-16 DIAGNOSIS — I5032 Chronic diastolic (congestive) heart failure: Secondary | ICD-10-CM | POA: Diagnosis not present

## 2024-02-16 MED ORDER — HYDRALAZINE HCL 10 MG PO TABS: 10.0000 mg | ORAL_TABLET | ORAL | 3 refills | Status: AC | PRN

## 2024-02-16 MED ORDER — HYDRALAZINE HCL 50 MG PO TABS: 50.0000 mg | ORAL_TABLET | Freq: Two times a day (BID) | ORAL | 3 refills | Status: AC

## 2024-02-16 NOTE — Patient Instructions (Signed)
 Medication Instructions:  Increase Hydralazine  to 50 mg twice daily Take an extra 10 mg of Hydralazine  for a systolic blood pressure (top number) greater than 170  *If you need a refill on your cardiac medications before your next appointment, please call your pharmacy*  Lab Work: NONE If you have labs (blood work) drawn today and your tests are completely normal, you will receive your results only by: MyChart Message (if you have MyChart) OR A paper copy in the mail If you have any lab test that is abnormal or we need to change your treatment, we will call you to review the results.  Testing/Procedures: Echocardiogram Your physician has requested that you have an echocardiogram. Echocardiography is a painless test that uses sound waves to create images of your heart. It provides your doctor with information about the size and shape of your heart and how well your heart's chambers and valves are working. This procedure takes approximately one hour. There are no restrictions for this procedure. Please do NOT wear cologne, perfume, aftershave, or lotions (deodorant is allowed). Please arrive 15 minutes prior to your appointment time.  Please note: We ask at that you not bring children with you during ultrasound (echo/ vascular) testing. Due to room size and safety concerns, children are not allowed in the ultrasound rooms during exams. Our front office staff cannot provide observation of children in our lobby area while testing is being conducted. An adult accompanying a patient to their appointment will only be allowed in the ultrasound room at the discretion of the ultrasound technician under special circumstances. We apologize for any inconvenience.   Follow-Up: At [facility], you and your health needs are our priority.  As part of our continuing mission to provide you with exceptional heart care, our providers are all part of one team.  This team includes your primary Cardiologist  (physician) and Advanced Practice Providers or APPs (Physician Assistants and Nurse Practitioners) who all work together to provide you with the care you need, when you need it.  Your next appointment:   1 year  Provider:   [place], MD  We recommend signing up for the patient portal called MyChart.  Sign up information is provided on this After Visit Summary.  MyChart is used to connect with patients for Virtual Visits (Telemedicine).  Patients are able to view lab/test results, encounter notes, upcoming appointments, etc.  Non-urgent messages can be sent to your provider as well.   To learn more about what you can do with MyChart, go to [email or web address].

## 2024-02-17 ENCOUNTER — Other Ambulatory Visit: Payer: Self-pay | Admitting: Cardiology

## 2024-02-17 NOTE — Telephone Encounter (Signed)
 Fax from [facility] needing further clarification on sig. How often is pt supposed to be taking. Doc Scanned to media

## 2024-02-17 NOTE — Telephone Encounter (Signed)
 Spoke with pharmacist at [facility] regarding clarification for prn hydralazine . Pt is to take 1 10 mg tablet as needed for systolic blood pressure greater than 170. Pharmacist verbalized understanding. All questions if any were answered.

## 2024-03-04 ENCOUNTER — Ambulatory Visit
Admission: RE | Admit: 2024-03-04 | Discharge: 2024-03-04 | Disposition: A | Source: Ambulatory Visit | Attending: Family Medicine | Admitting: Family Medicine

## 2024-03-04 DIAGNOSIS — N2889 Other specified disorders of kidney and ureter: Secondary | ICD-10-CM

## 2024-03-04 MED ORDER — GADOPICLENOL 0.5 MMOL/ML IV SOLN
8.0000 mL | Freq: Once | INTRAVENOUS | Status: AC | PRN
  Administered 2024-03-04: 8 mL via INTRAVENOUS

## 2024-03-08 ENCOUNTER — Other Ambulatory Visit: Payer: Self-pay | Admitting: Cardiology

## 2024-03-13 ENCOUNTER — Encounter: Payer: Self-pay | Admitting: Podiatry

## 2024-03-13 ENCOUNTER — Ambulatory Visit: Admitting: Podiatry

## 2024-03-13 DIAGNOSIS — B351 Tinea unguium: Secondary | ICD-10-CM

## 2024-03-13 DIAGNOSIS — E1142 Type 2 diabetes mellitus with diabetic polyneuropathy: Secondary | ICD-10-CM

## 2024-03-13 DIAGNOSIS — M79675 Pain in left toe(s): Secondary | ICD-10-CM

## 2024-03-13 DIAGNOSIS — M79674 Pain in right toe(s): Secondary | ICD-10-CM | POA: Diagnosis not present

## 2024-03-13 DIAGNOSIS — I739 Peripheral vascular disease, unspecified: Secondary | ICD-10-CM

## 2024-03-18 NOTE — Progress Notes (Signed)
  Subjective:  Patient ID: Alexandra Green, female    DOB: August 04, 1940,  MRN: [id number]  Alexandra Green presents to clinic today for for annual diabetic foot examination, at risk footcare. Patient has h/o diabetes, neuropathy and PAD and is seen for , and painful thick toenails that are difficult to trim. Pain interferes with ambulation. Aggravating factors include wearing enclosed shoe gear. Pain is relieved with periodic professional debridement.  Chief Complaint  Patient presents with   Nail Problem    Thick painful toenails, 4 month follow up    New problem(s): None.   PCP is Benjamine Aland, MD. Alexandra Green 11/09/23.   No Known Allergies  Review of Systems: Negative except as noted in the HPI.  Objective:  There were no vitals filed for this visit. Alexandra Green is a pleasant 83 y.o. female in NAD. AAO x 3.  Vascular Examination: CFT <3 seconds b/l. DP pulses faintly palpable b/l. PT pulses nonpalpable b/l. Digital hair absent. Skin temperature gradient warm to warm b/l. No pain with calf compression. No ischemia or gangrene. No cyanosis or clubbing noted b/l. Dependent edema noted b/l LE.   Neurological Examination: Sensation grossly intact b/l with 10 gram monofilament.   Dermatological Examination: Pedal skin warm and supple b/l. No open wounds b/l. No interdigital macerations. Toenails 1-5 b/l thick, discolored, elongated with subungual debris and pain on dorsal palpation. No hyperkeratotic nor porokeratotic lesions.  Musculoskeletal Examination: Muscle strength 5/5 to all lower extremity muscle groups bilaterally. No pain, crepitus or joint limitation noted with ROM b/l LE. Pes planus b/l. Hammertoe(s) 2-5 b/l. Pes planus deformity noted bilateral LE.Alexandra Green Patient ambulates with rollator assistance.  Radiographs: None  Assessment/Plan: 1. Pain due to onychomycosis of toenails of both feet   2. PAD (peripheral artery disease)   3. Diabetic polyneuropathy associated with type  2 diabetes mellitus [facility])   Consent given for treatment. Patient examined. All patient's and/or POA's questions/concerns addressed on today's visit. Mycotic toenails 1-5 b/l debrided in length and girth without incident. Continue foot and shoe inspections daily. Monitor blood glucose per PCP/Endocrinologist's recommendations.Continue soft, supportive shoe gear daily. Report any pedal injuries to medical professional. Call office if there are any quesitons/concerns. -Patient/POA to call should there be question/concern in the interim.   Return in about 3 months (around 06/11/2024).  Alexandra Green, DPM      [facility] LOCATION: [address] [place] [address]                   Office [phone]   [facility] LOCATION: [address] [place], [place] [address] Office [phone]

## 2024-03-26 ENCOUNTER — Ambulatory Visit (HOSPITAL_COMMUNITY): Admission: RE | Admit: 2024-03-26 | Discharge: 2024-03-26 | Attending: Cardiology | Admitting: Cardiology

## 2024-03-26 DIAGNOSIS — I340 Nonrheumatic mitral (valve) insufficiency: Secondary | ICD-10-CM | POA: Diagnosis present

## 2024-03-27 LAB — ECHOCARDIOGRAM COMPLETE
Area-P 1/2: 2.13 cm2
S' Lateral: 2.9 cm

## 2024-03-30 ENCOUNTER — Other Ambulatory Visit: Payer: Self-pay | Admitting: Gastroenterology

## 2024-03-30 DIAGNOSIS — K219 Gastro-esophageal reflux disease without esophagitis: Secondary | ICD-10-CM

## 2024-04-01 ENCOUNTER — Ambulatory Visit: Payer: Self-pay | Admitting: Cardiology

## 2024-04-09 NOTE — Telephone Encounter (Signed)
 Pt returning call. Please advise.

## 2024-07-11 ENCOUNTER — Ambulatory Visit: Admitting: Podiatry
